# Patient Record
Sex: Female | Born: 1940 | Race: White | Hispanic: No | Marital: Married | Smoking: Former smoker
Health system: Southern US, Community
[De-identification: ages and names within clinical notes are randomized; demographics above are authoritative.]

## PROBLEM LIST (undated history)

## (undated) DIAGNOSIS — Z9981 Dependence on supplemental oxygen: Secondary | ICD-10-CM

## (undated) DIAGNOSIS — J189 Pneumonia, unspecified organism: Secondary | ICD-10-CM

## (undated) DIAGNOSIS — G709 Myoneural disorder, unspecified: Secondary | ICD-10-CM

## (undated) DIAGNOSIS — E785 Hyperlipidemia, unspecified: Secondary | ICD-10-CM

## (undated) DIAGNOSIS — E119 Type 2 diabetes mellitus without complications: Secondary | ICD-10-CM

## (undated) DIAGNOSIS — K589 Irritable bowel syndrome without diarrhea: Secondary | ICD-10-CM

## (undated) DIAGNOSIS — Z972 Presence of dental prosthetic device (complete) (partial): Secondary | ICD-10-CM

## (undated) DIAGNOSIS — K219 Gastro-esophageal reflux disease without esophagitis: Secondary | ICD-10-CM

## (undated) DIAGNOSIS — M069 Rheumatoid arthritis, unspecified: Secondary | ICD-10-CM

## (undated) DIAGNOSIS — E039 Hypothyroidism, unspecified: Secondary | ICD-10-CM

## (undated) DIAGNOSIS — J069 Acute upper respiratory infection, unspecified: Secondary | ICD-10-CM

## (undated) DIAGNOSIS — J45909 Unspecified asthma, uncomplicated: Secondary | ICD-10-CM

## (undated) DIAGNOSIS — I10 Essential (primary) hypertension: Secondary | ICD-10-CM

## (undated) DIAGNOSIS — M797 Fibromyalgia: Secondary | ICD-10-CM

## (undated) DIAGNOSIS — IMO0002 Reserved for concepts with insufficient information to code with codable children: Secondary | ICD-10-CM

## (undated) DIAGNOSIS — G2581 Restless legs syndrome: Secondary | ICD-10-CM

## (undated) DIAGNOSIS — R0600 Dyspnea, unspecified: Secondary | ICD-10-CM

## (undated) DIAGNOSIS — M329 Systemic lupus erythematosus, unspecified: Secondary | ICD-10-CM

## (undated) DIAGNOSIS — M1990 Unspecified osteoarthritis, unspecified site: Secondary | ICD-10-CM

## (undated) DIAGNOSIS — E079 Disorder of thyroid, unspecified: Secondary | ICD-10-CM

## (undated) DIAGNOSIS — J449 Chronic obstructive pulmonary disease, unspecified: Secondary | ICD-10-CM

## (undated) DIAGNOSIS — F419 Anxiety disorder, unspecified: Secondary | ICD-10-CM

## (undated) DIAGNOSIS — R921 Mammographic calcification found on diagnostic imaging of breast: Secondary | ICD-10-CM

## (undated) DIAGNOSIS — Z5189 Encounter for other specified aftercare: Secondary | ICD-10-CM

## (undated) HISTORY — DX: Unspecified osteoarthritis, unspecified site: M19.90

## (undated) HISTORY — DX: Essential (primary) hypertension: I10

## (undated) HISTORY — DX: Unspecified asthma, uncomplicated: J45.909

## (undated) HISTORY — PX: NASAL SEPTUM SURGERY: SHX37

## (undated) HISTORY — PX: ABDOMINAL HYSTERECTOMY: SHX81

## (undated) HISTORY — DX: Myoneural disorder, unspecified: G70.9

## (undated) HISTORY — DX: Acute upper respiratory infection, unspecified: J06.9

## (undated) HISTORY — PX: CHOLECYSTECTOMY: SHX55

## (undated) HISTORY — PX: KNEE ARTHROSCOPY: SUR90

## (undated) HISTORY — DX: Disorder of thyroid, unspecified: E07.9

## (undated) HISTORY — PX: EYE SURGERY: SHX253

## (undated) HISTORY — DX: Encounter for other specified aftercare: Z51.89

## (undated) HISTORY — DX: Mammographic calcification found on diagnostic imaging of breast: R92.1

## (undated) HISTORY — DX: Chronic obstructive pulmonary disease, unspecified: J44.9

## (undated) HISTORY — PX: MINIMALLY INVASIVE MICRODISCECTOMY LUMBAR SPINE: SHX2037

---
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
## 2006-10-25 ENCOUNTER — Other Ambulatory Visit: Admission: RE | Admit: 2006-10-25 | Discharge: 2006-10-25 | Payer: Self-pay | Admitting: Otolaryngology

## 2006-10-26 ENCOUNTER — Ambulatory Visit (HOSPITAL_COMMUNITY): Admission: RE | Admit: 2006-10-26 | Discharge: 2006-10-26 | Payer: Self-pay | Admitting: Otolaryngology

## 2006-11-04 ENCOUNTER — Ambulatory Visit: Admission: RE | Admit: 2006-11-04 | Discharge: 2006-11-04 | Payer: Self-pay | Admitting: Otolaryngology

## 2006-11-25 ENCOUNTER — Ambulatory Visit: Payer: Self-pay | Admitting: Pulmonary Disease

## 2013-02-06 ENCOUNTER — Other Ambulatory Visit: Payer: Self-pay | Admitting: Internal Medicine

## 2013-02-06 DIAGNOSIS — R921 Mammographic calcification found on diagnostic imaging of breast: Secondary | ICD-10-CM

## 2013-02-09 DIAGNOSIS — R0602 Shortness of breath: Secondary | ICD-10-CM

## 2013-02-14 ENCOUNTER — Ambulatory Visit
Admission: RE | Admit: 2013-02-14 | Discharge: 2013-02-14 | Disposition: A | Discharge status: Home / Self Care | Payer: Medicare Other | Source: Ambulatory Visit | Attending: Internal Medicine | Admitting: Internal Medicine

## 2013-02-14 DIAGNOSIS — R921 Mammographic calcification found on diagnostic imaging of breast: Secondary | ICD-10-CM

## 2013-02-28 ENCOUNTER — Encounter (INDEPENDENT_AMBULATORY_CARE_PROVIDER_SITE_OTHER): Payer: Self-pay | Admitting: Surgery

## 2013-02-28 ENCOUNTER — Ambulatory Visit (INDEPENDENT_AMBULATORY_CARE_PROVIDER_SITE_OTHER): Payer: Medicare Other | Admitting: Surgery

## 2013-02-28 VITALS — BP 118/78 | HR 96 | Temp 97.6°F | Resp 16 | Ht 66.0 in | Wt 183.8 lb

## 2013-02-28 DIAGNOSIS — R928 Other abnormal and inconclusive findings on diagnostic imaging of breast: Secondary | ICD-10-CM

## 2013-02-28 DIAGNOSIS — R921 Mammographic calcification found on diagnostic imaging of breast: Secondary | ICD-10-CM | POA: Insufficient documentation

## 2013-02-28 HISTORY — DX: Mammographic calcification found on diagnostic imaging of breast: R92.1

## 2013-02-28 NOTE — Progress Notes (Signed)
NAMEJAYNI Taylor DOB: 1941/01/04 MRN: [id number]                                                                                      DATE: 02/28/2013  PCP: Monico Blitz, MD Referring Provider: No ref. provider found  IMPRESSION:  Right breast calcifications and sclerosing lesion seen on the needle core biopsy  PLAN:   I have recommended wire localized excision. I have discussed the indications for the lumpectomy and described the procedure. She understand that the chance of removal of the abnormal area is very good, but that occasionally we are unable to locate it and may have to do a second procedure. We also discussed the possibility of a second procedure to get additional tissue. Risks of surgery such as bleeding and infection have also been explained, as well as the implications of not doing the surgery. She understands and wishes to proceed.                  CC:  Chief Complaint  Patient presents with  . Breast Problem    eval lt breast    HPI:  Jean Taylor is a 72 y.o.  female who presents for evaluation of The right breast. Calcifications with a complex sclerosing lesion found on needle core biopsy. Radiology recommended excision. She has had some right breast discomfort laterally but no other breast symptoms. She has no other breast problems in the past and no family history of breast cancer. She is otherwise having symptoms. The area that was biopsied is in the medial aspect of the right breast.  PMH:  has a past medical history of Arthritis; Asthma; Blood transfusion without reported diagnosis; COPD (chronic obstructive pulmonary disease); Hypertension; Neuromuscular disorder; and Thyroid disease.  PSH:   has past surgical history that includes Abdominal hysterectomy; Cholecystectomy; Nasal septum surgery; Minimally invasive microdiscectomy lumbar spine; and Knee arthroscopy (Left).  ALLERGIES:   Allergies  Allergen Reactions  . Demerol (Meperidine)   . Morphine And  Related     MEDICATIONS: Current outpatient prescriptions:doxepin (SINEQUAN) 50 MG capsule, , Disp: , Rfl: ;  EXFORGE 5-160 MG per tablet, , Disp: , Rfl: ;  gabapentin (NEURONTIN) 300 MG capsule, , Disp: , Rfl: ;  levothyroxine (SYNTHROID, LEVOTHROID) 50 MCG tablet, , Disp: , Rfl: ;  LORazepam (ATIVAN) 1 MG tablet, , Disp: , Rfl: ;  meclizine (ANTIVERT) 25 MG tablet, , Disp: , Rfl: ;  omeprazole (PRILOSEC) 20 MG capsule, , Disp: , Rfl:  theophylline (THEODUR) 200 MG 12 hr tablet, , Disp: , Rfl: ;  traMADol (ULTRAM) 50 MG tablet, , Disp: , Rfl: ;  triamcinolone cream (KENALOG) 0.1 %, , Disp: , Rfl: ;  predniSONE (STERAPRED UNI-PAK) 5 MG TABS, , Disp: , Rfl:   ROS: She has filled out our 12 point review of systems and it is negative .  VITAL SIGNS:  BP 118/78  Pulse 96  Temp(Src) 97.6 F (36.4 C) (Temporal)  Resp 16  Ht 5\' 6"  (1.676 m)  Wt 183 lb 12.8 oz (83.371 kg)  BMI 29.68 kg/m2  GENERAL:  The patient is alert, oriented, and generally healthy-appearing, NAD.  Mood and affect are normal.  HEENT:  The head is normocephalic, the eyes nonicteric, the pupils were round regular and equal. EOMs are normal. Pharynx normal. Dentition good.  NECK:  The neck is supple and there are no masses or thyromegaly.  LUNGS: Normal respirations and clear to auscultation.  HEART: Regular rhythm, with no murmurs rubs or gallops. Pulses are intact carotid dorsalis pedis and posterior tibial. No significant varicosities are noted.  BREASTS: The breasts are symmetric in size and shape. There is no palpable mass. There are no nipple skin or areolar abnormalities. She is a little tender laterally on the right. There is a small ecchymosis medially on the right.  Lymphatics: There is no axillary or supraclavicular adenopathy on either side.  ABDOMEN: Soft, flat, and nontender. No masses or organomegaly is noted. No hernias are noted. Bowel sounds are normal.  EXTREMITIES:  Good range of motion, no  edema.    DATA REVIEWED:  I reviewed pathology and radiology reports.    [name] J 02/28/2013  CC: No ref. provider found, Monico Blitz, MD

## 2013-02-28 NOTE — Patient Instructions (Signed)
We'll schedule surgery to remove the small abnormality in your right breast

## 2013-03-02 ENCOUNTER — Encounter (HOSPITAL_BASED_OUTPATIENT_CLINIC_OR_DEPARTMENT_OTHER): Payer: Self-pay | Admitting: *Deleted

## 2013-03-02 NOTE — Progress Notes (Signed)
Pt had ekg and labs 2 weeks ago-did routine stress and echo-does not see a cardiologist

## 2013-03-08 ENCOUNTER — Ambulatory Visit
Admission: RE | Admit: 2013-03-08 | Discharge: 2013-03-08 | Disposition: A | Discharge status: Home / Self Care | Payer: Medicare Other | Source: Ambulatory Visit | Attending: Surgery | Admitting: Surgery

## 2013-03-08 ENCOUNTER — Encounter (HOSPITAL_BASED_OUTPATIENT_CLINIC_OR_DEPARTMENT_OTHER): Payer: Self-pay | Admitting: *Deleted

## 2013-03-08 ENCOUNTER — Ambulatory Visit (HOSPITAL_BASED_OUTPATIENT_CLINIC_OR_DEPARTMENT_OTHER)
Admission: RE | Admit: 2013-03-08 | Discharge: 2013-03-08 | Disposition: A | Discharge status: Home / Self Care | Payer: Medicare Other | Source: Ambulatory Visit | Attending: Surgery | Admitting: Surgery

## 2013-03-08 ENCOUNTER — Encounter (HOSPITAL_BASED_OUTPATIENT_CLINIC_OR_DEPARTMENT_OTHER)
Admission: RE | Disposition: A | Discharge status: Home / Self Care | Payer: Self-pay | Source: Ambulatory Visit | Attending: Surgery

## 2013-03-08 ENCOUNTER — Ambulatory Visit (HOSPITAL_BASED_OUTPATIENT_CLINIC_OR_DEPARTMENT_OTHER): Payer: Medicare Other | Admitting: *Deleted

## 2013-03-08 DIAGNOSIS — J4489 Other specified chronic obstructive pulmonary disease: Secondary | ICD-10-CM | POA: Insufficient documentation

## 2013-03-08 DIAGNOSIS — N6089 Other benign mammary dysplasias of unspecified breast: Secondary | ICD-10-CM

## 2013-03-08 DIAGNOSIS — Z79899 Other long term (current) drug therapy: Secondary | ICD-10-CM | POA: Insufficient documentation

## 2013-03-08 DIAGNOSIS — R921 Mammographic calcification found on diagnostic imaging of breast: Secondary | ICD-10-CM

## 2013-03-08 DIAGNOSIS — G709 Myoneural disorder, unspecified: Secondary | ICD-10-CM | POA: Insufficient documentation

## 2013-03-08 DIAGNOSIS — J449 Chronic obstructive pulmonary disease, unspecified: Secondary | ICD-10-CM | POA: Insufficient documentation

## 2013-03-08 DIAGNOSIS — Z885 Allergy status to narcotic agent status: Secondary | ICD-10-CM | POA: Insufficient documentation

## 2013-03-08 DIAGNOSIS — E079 Disorder of thyroid, unspecified: Secondary | ICD-10-CM | POA: Insufficient documentation

## 2013-03-08 DIAGNOSIS — M129 Arthropathy, unspecified: Secondary | ICD-10-CM | POA: Insufficient documentation

## 2013-03-08 DIAGNOSIS — Z90710 Acquired absence of both cervix and uterus: Secondary | ICD-10-CM | POA: Insufficient documentation

## 2013-03-08 DIAGNOSIS — N6039 Fibrosclerosis of unspecified breast: Secondary | ICD-10-CM | POA: Insufficient documentation

## 2013-03-08 DIAGNOSIS — I10 Essential (primary) hypertension: Secondary | ICD-10-CM | POA: Insufficient documentation

## 2013-03-08 HISTORY — DX: Restless legs syndrome: G25.81

## 2013-03-08 HISTORY — DX: Rheumatoid arthritis, unspecified: M06.9

## 2013-03-08 HISTORY — PX: BREAST BIOPSY: SHX20

## 2013-03-08 HISTORY — DX: Anxiety disorder, unspecified: F41.9

## 2013-03-08 HISTORY — DX: Presence of dental prosthetic device (complete) (partial): Z97.2

## 2013-03-08 HISTORY — DX: Fibromyalgia: M79.7

## 2013-03-08 LAB — POCT I-STAT, CHEM 8
BUN: 10 mg/dL (ref 6–23)
Calcium, Ion: 1.18 mmol/L (ref 1.13–1.30)
Chloride: 101 mEq/L (ref 96–112)
Creatinine, Ser: 1 mg/dL (ref 0.50–1.10)
Glucose, Bld: 108 mg/dL — ABNORMAL HIGH (ref 70–99)
Potassium: 4.1 mEq/L (ref 3.5–5.1)

## 2013-03-08 SURGERY — BREAST BIOPSY WITH NEEDLE LOCALIZATION
Anesthesia: General | Site: Breast | Laterality: Right | Wound class: Clean

## 2013-03-08 MED ORDER — HYDROMORPHONE HCL PF 1 MG/ML IJ SOLN: 0.2500 mg | INTRAMUSCULAR | Status: DC | PRN

## 2013-03-08 MED ORDER — OXYCODONE HCL 5 MG PO TABS: 5.0000 mg | ORAL_TABLET | Freq: Once | ORAL | Status: DC | PRN

## 2013-03-08 MED ORDER — EPHEDRINE SULFATE 50 MG/ML IJ SOLN
INTRAMUSCULAR | Status: DC | PRN
  Administered 2013-03-08: 5 mg via INTRAVENOUS
  Administered 2013-03-08: 10 mg via INTRAVENOUS
  Administered 2013-03-08: 5 mg via INTRAVENOUS

## 2013-03-08 MED ORDER — PROPOFOL 10 MG/ML IV BOLUS
INTRAVENOUS | Status: DC | PRN
  Administered 2013-03-08: 160 mg via INTRAVENOUS

## 2013-03-08 MED ORDER — LIDOCAINE HCL (CARDIAC) 20 MG/ML IV SOLN
INTRAVENOUS | Status: DC | PRN
  Administered 2013-03-08: 100 mg via INTRAVENOUS

## 2013-03-08 MED ORDER — PROMETHAZINE HCL 25 MG/ML IJ SOLN: 6.2500 mg | INTRAMUSCULAR | Status: DC | PRN

## 2013-03-08 MED ORDER — MIDAZOLAM HCL 5 MG/5ML IJ SOLN
INTRAMUSCULAR | Status: DC | PRN
  Administered 2013-03-08: 1 mg via INTRAVENOUS

## 2013-03-08 MED ORDER — BUPIVACAINE HCL (PF) 0.25 % IJ SOLN
INTRAMUSCULAR | Status: DC | PRN
  Administered 2013-03-08: 30 mL

## 2013-03-08 MED ORDER — CEFAZOLIN SODIUM-DEXTROSE 2-3 GM-% IV SOLR
2.0000 g | INTRAVENOUS | Status: AC
  Administered 2013-03-08: 2 g via INTRAVENOUS

## 2013-03-08 MED ORDER — OXYCODONE HCL 5 MG/5ML PO SOLN: 5.0000 mg | Freq: Once | ORAL | Status: DC | PRN

## 2013-03-08 MED ORDER — FENTANYL CITRATE 0.05 MG/ML IJ SOLN
INTRAMUSCULAR | Status: DC | PRN
  Administered 2013-03-08: 50 ug via INTRAVENOUS

## 2013-03-08 MED ORDER — LACTATED RINGERS IV SOLN
INTRAVENOUS | Status: DC
  Administered 2013-03-08 (×2): via INTRAVENOUS

## 2013-03-08 MED ORDER — CHLORHEXIDINE GLUCONATE 4 % EX LIQD: 1.0000 "application " | Freq: Once | CUTANEOUS | Status: DC

## 2013-03-08 MED ORDER — DEXAMETHASONE SODIUM PHOSPHATE 4 MG/ML IJ SOLN
INTRAMUSCULAR | Status: DC | PRN
  Administered 2013-03-08: 8 mg via INTRAVENOUS

## 2013-03-08 MED ORDER — HYDROCODONE-ACETAMINOPHEN 5-325 MG PO TABS: 1.0000 | ORAL_TABLET | ORAL | Status: DC | PRN

## 2013-03-08 MED ORDER — ONDANSETRON HCL 4 MG/2ML IJ SOLN
INTRAMUSCULAR | Status: DC | PRN
  Administered 2013-03-08: 4 mg via INTRAVENOUS

## 2013-03-08 SURGICAL SUPPLY — 48 items
APPLICATOR COTTON TIP 6IN STRL (MISCELLANEOUS) IMPLANT
BINDER BREAST LRG (GAUZE/BANDAGES/DRESSINGS) IMPLANT
BINDER BREAST MEDIUM (GAUZE/BANDAGES/DRESSINGS) IMPLANT
BINDER BREAST XLRG (GAUZE/BANDAGES/DRESSINGS) ×2 IMPLANT
BINDER BREAST XXLRG (GAUZE/BANDAGES/DRESSINGS) IMPLANT
BLADE HEX COATED 2.75 (ELECTRODE) ×2 IMPLANT
BLADE SURG 15 STRL LF DISP TIS (BLADE) ×1 IMPLANT
BLADE SURG 15 STRL SS (BLADE) ×1
CANISTER SUCTION 1200CC (MISCELLANEOUS) ×2 IMPLANT
CHLORAPREP W/TINT 26ML (MISCELLANEOUS) ×2 IMPLANT
CLIP TI MEDIUM 6 (CLIP) IMPLANT
CLIP TI WIDE RED SMALL 6 (CLIP) IMPLANT
CLOTH BEACON ORANGE TIMEOUT ST (SAFETY) ×2 IMPLANT
COVER MAYO STAND STRL (DRAPES) ×2 IMPLANT
COVER TABLE BACK 60X90 (DRAPES) ×2 IMPLANT
DECANTER SPIKE VIAL GLASS SM (MISCELLANEOUS) IMPLANT
DERMABOND ADVANCED (GAUZE/BANDAGES/DRESSINGS) ×1
DERMABOND ADVANCED .7 DNX12 (GAUZE/BANDAGES/DRESSINGS) ×1 IMPLANT
DEVICE DUBIN W/COMP PLATE 8390 (MISCELLANEOUS) ×4 IMPLANT
DRAPE LAPAROTOMY TRNSV 102X78 (DRAPE) ×2 IMPLANT
DRAPE SURG 17X23 STRL (DRAPES) IMPLANT
DRAPE UTILITY XL STRL (DRAPES) ×2 IMPLANT
ELECT REM PT RETURN 9FT ADLT (ELECTROSURGICAL) ×2
ELECTRODE REM PT RTRN 9FT ADLT (ELECTROSURGICAL) ×1 IMPLANT
GLOVE BIOGEL M STRL SZ7.5 (GLOVE) ×2 IMPLANT
GLOVE BIOGEL PI IND STRL 8 (GLOVE) ×1 IMPLANT
GLOVE BIOGEL PI INDICATOR 8 (GLOVE) ×1
GLOVE EUDERMIC 7 POWDERFREE (GLOVE) ×2 IMPLANT
GLOVE EXAM NITRILE LRG STRL (GLOVE) ×2 IMPLANT
GOWN PREVENTION PLUS XLARGE (GOWN DISPOSABLE) ×4 IMPLANT
KIT MARKER MARGIN INK (KITS) ×2 IMPLANT
NEEDLE HYPO 25X1 1.5 SAFETY (NEEDLE) ×2 IMPLANT
NS IRRIG 1000ML POUR BTL (IV SOLUTION) IMPLANT
PACK BASIN DAY SURGERY FS (CUSTOM PROCEDURE TRAY) ×2 IMPLANT
PENCIL BUTTON HOLSTER BLD 10FT (ELECTRODE) ×2 IMPLANT
SHEET MEDIUM DRAPE 40X70 STRL (DRAPES) IMPLANT
SLEEVE SCD COMPRESS KNEE MED (MISCELLANEOUS) ×2 IMPLANT
SPONGE GAUZE 4X4 12PLY (GAUZE/BANDAGES/DRESSINGS) IMPLANT
SPONGE INTESTINAL PEANUT (DISPOSABLE) IMPLANT
SPONGE LAP 4X18 X RAY DECT (DISPOSABLE) ×2 IMPLANT
STAPLER VISISTAT 35W (STAPLE) IMPLANT
SUT MNCRL AB 4-0 PS2 18 (SUTURE) ×2 IMPLANT
SUT SILK 0 TIES 10X30 (SUTURE) IMPLANT
SUT VICRYL 3-0 CR8 SH (SUTURE) ×2 IMPLANT
SYR CONTROL 10ML LL (SYRINGE) ×2 IMPLANT
TOWEL OR NON WOVEN STRL DISP B (DISPOSABLE) ×2 IMPLANT
TUBE CONNECTING 20X1/4 (TUBING) ×2 IMPLANT
YANKAUER SUCT BULB TIP NO VENT (SUCTIONS) ×2 IMPLANT

## 2013-03-08 NOTE — Anesthesia Procedure Notes (Signed)
Procedure Name: LMA Insertion Date/Time: 03/08/2013 12:43 PM Performed by: Carney [place]ers Pre-anesthesia Checklist: Patient identified, Emergency Drugs available, Suction available and Patient being monitored Patient Re-evaluated:Patient Re-evaluated prior to inductionOxygen Delivery Method: Circle System Utilized Preoxygenation: Pre-oxygenation with 100% oxygen Intubation Type: IV induction Ventilation: Mask ventilation without difficulty LMA: LMA inserted LMA Size: 4.0 Number of attempts: 1 Airway Equipment and Method: bite block Placement Confirmation: positive ETCO2 and breath sounds checked- equal and bilateral Tube secured with: Tape Dental Injury: Teeth and Oropharynx as per pre-operative assessment

## 2013-03-08 NOTE — Op Note (Signed)
Jean Taylor 1940/11/21 [id number] 03/01/2013  Preoperative diagnosis: right breast calcifications and complex sclerosing lesion on needle core biopsy  Postoperative diagnosis: same  Procedure: wire localized excision of breast calcifications and a complex sclerosing lesion right breast  Surgeon: Haywood Lasso, MD, FACS   Anesthesia: General   Clinical History and Indications: this patient has an area of calcifications and a small nodule and biopsies have shown a complex sclerosing lesion. Wire localized excision was recommended.    Description of Procedure: the patient was seen in the preoperative area, plans for surgery confirmed, the right breast marked as the operative side, and the guidewire films were reviewed. The patient was taken to the operating room and after satisfactory general anesthesia was obtained, the right breast was prepped and draped in the time out was performed.  There were 2 guidewires, both anteromedially and tracks towards the areola. I made a transverse incision midway between the guidewires parallel to the tract of the guidewires. I then elevated the skin medially, inferiorly and superiorly. Identify the breast tissue down towards the chest wall starting medially and then working both superior and inferior and with traction going lateral to the guidewires. I had removed all the tissue between the wires. The specimen mammogram showed the calcifications but not the clip. In discussing this with the radiologist he thought the clip was near the more inferior wire and fairly superficial. I looked in that area and sought that just inferior to the inferior wire an area of tissue that I thought contained the clip and this was removed. Specimen mammogram showed this did contain the clip.  The area was irrigated and made sure everything was dry. 20 cc of 0.25% plain Marcaine was infiltrated for postop pain relief. The incision was closed with interrupted 3-0 Vicryl,  running 4-0 Monocryl subcuticular, and Dermabond.  The patient tolerated the procedure well. There are no complications. Counts were correct. Blood loss was minimal.  Haywood Lasso, MD, FACS 03/08/2013 1:32 PM

## 2013-03-08 NOTE — H&P (View-Only) (Signed)
Jean Taylor DOB: November 28, 1940 MRN: [id number]                                                                                      DATE: 02/28/2013  PCP: Monico Blitz, MD Referring Provider: No ref. provider found  IMPRESSION:  Right breast calcifications and sclerosing lesion seen on the needle core biopsy  PLAN:   I have recommended wire localized excision. I have discussed the indications for the lumpectomy and described the procedure. She understand that the chance of removal of the abnormal area is very good, but that occasionally we are unable to locate it and may have to do a second procedure. We also discussed the possibility of a second procedure to get additional tissue. Risks of surgery such as bleeding and infection have also been explained, as well as the implications of not doing the surgery. She understands and wishes to proceed.                  CC:  Chief Complaint  Patient presents with  . Breast Problem    eval lt breast    HPI:  Jean Taylor is a 72 y.o.  female who presents for evaluation of The right breast. Calcifications with a complex sclerosing lesion found on needle core biopsy. Radiology recommended excision. She has had some right breast discomfort laterally but no other breast symptoms. She has no other breast problems in the past and no family history of breast cancer. She is otherwise having symptoms. The area that was biopsied is in the medial aspect of the right breast.  PMH:  has a past medical history of Arthritis; Asthma; Blood transfusion without reported diagnosis; COPD (chronic obstructive pulmonary disease); Hypertension; Neuromuscular disorder; and Thyroid disease.  PSH:   has past surgical history that includes Abdominal hysterectomy; Cholecystectomy; Nasal septum surgery; Minimally invasive microdiscectomy lumbar spine; and Knee arthroscopy (Left).  ALLERGIES:   Allergies  Allergen Reactions  . Demerol (Meperidine)   . Morphine And  Related     MEDICATIONS: Current outpatient prescriptions:doxepin (SINEQUAN) 50 MG capsule, , Disp: , Rfl: ;  EXFORGE 5-160 MG per tablet, , Disp: , Rfl: ;  gabapentin (NEURONTIN) 300 MG capsule, , Disp: , Rfl: ;  levothyroxine (SYNTHROID, LEVOTHROID) 50 MCG tablet, , Disp: , Rfl: ;  LORazepam (ATIVAN) 1 MG tablet, , Disp: , Rfl: ;  meclizine (ANTIVERT) 25 MG tablet, , Disp: , Rfl: ;  omeprazole (PRILOSEC) 20 MG capsule, , Disp: , Rfl:  theophylline (THEODUR) 200 MG 12 hr tablet, , Disp: , Rfl: ;  traMADol (ULTRAM) 50 MG tablet, , Disp: , Rfl: ;  triamcinolone cream (KENALOG) 0.1 %, , Disp: , Rfl: ;  predniSONE (STERAPRED UNI-PAK) 5 MG TABS, , Disp: , Rfl:   ROS: She has filled out our 12 point review of systems and it is negative .  VITAL SIGNS:  BP 118/78  Pulse 96  Temp(Src) 97.6 F (36.4 C) (Temporal)  Resp 16  Ht 5\' 6"  (1.676 m)  Wt 183 lb 12.8 oz (83.371 kg)  BMI 29.68 kg/m2  GENERAL:  The patient is alert, oriented, and generally healthy-appearing, NAD.  Mood and affect are normal.  HEENT:  The head is normocephalic, the eyes nonicteric, the pupils were round regular and equal. EOMs are normal. Pharynx normal. Dentition good.  NECK:  The neck is supple and there are no masses or thyromegaly.  LUNGS: Normal respirations and clear to auscultation.  HEART: Regular rhythm, with no murmurs rubs or gallops. Pulses are intact carotid dorsalis pedis and posterior tibial. No significant varicosities are noted.  BREASTS: The breasts are symmetric in size and shape. There is no palpable mass. There are no nipple skin or areolar abnormalities. She is a little tender laterally on the right. There is a small ecchymosis medially on the right.  Lymphatics: There is no axillary or supraclavicular adenopathy on either side.  ABDOMEN: Soft, flat, and nontender. No masses or organomegaly is noted. No hernias are noted. Bowel sounds are normal.  EXTREMITIES:  Good range of motion, no  edema.    DATA REVIEWED:  I reviewed pathology and radiology reports.    Britten Parady J 02/28/2013  CC: No ref. provider found, Monico Blitz, MD

## 2013-03-08 NOTE — Interval H&P Note (Signed)
History and Physical Interval Note:  03/08/2013 12:25 PM  Jean Taylor  has presented today for surgery, with the diagnosis of right breast calcifications  The various methods of treatment have been discussed with the patient and family. After consideration of risks, benefits and other options for treatment, the patient has consented to  Procedure(s):  NEEDLE LOCALIZATION REMOVAL RIGHT BREAST CALCIFICATIONS (Right) as a surgical intervention .  The patient's history has been reviewed, patient examined, no change in status, stable for surgery.  I have reviewed the patient's chart and labs.  Questions were answered to the patient's satisfaction.    The right breast is marked as the operative side. I reviewed the wire localizing films   Paulette Lynch J

## 2013-03-08 NOTE — Anesthesia Preprocedure Evaluation (Addendum)
Anesthesia Evaluation  Patient identified by MRN, date of birth, ID band Patient awake    Reviewed: Allergy & Precautions, H&P , NPO status , Patient's Chart, lab work & pertinent test results  History of Anesthesia Complications Negative for: history of anesthetic complications  Airway Mallampati: II  Neck ROM: Full  Mouth opening: Limited Mouth Opening  Dental  (+) Teeth Intact, Edentulous Upper and Partial Lower   Pulmonary asthma , COPD breath sounds clear to auscultation        Cardiovascular hypertension, Rhythm:Regular Rate:Normal     Neuro/Psych  Neuromuscular disease    GI/Hepatic   Endo/Other    Renal/GU      Musculoskeletal  (+) Arthritis -, Fibromyalgia -  Abdominal   Peds  Hematology   Anesthesia Other Findings   Reproductive/Obstetrics                          Anesthesia Physical Anesthesia Plan  ASA: III  Anesthesia Plan: General   Post-op Pain Management:    Induction: Intravenous  Airway Management Planned: LMA  Additional Equipment:   Intra-op Plan:   Post-operative Plan: Extubation in OR  Informed Consent:   Dental advisory given  Plan Discussed with: CRNA and Surgeon  Anesthesia Plan Comments:         Anesthesia Quick Evaluation

## 2013-03-08 NOTE — Transfer of Care (Signed)
Immediate Anesthesia Transfer of Care Note  Patient: Jean Taylor  Procedure(s) Performed: Procedure(s):  NEEDLE LOCALIZATION REMOVAL RIGHT BREAST CALCIFICATIONS (Right)  Patient Location: PACU  Anesthesia Type:General  Level of Consciousness: awake, alert  and oriented  Airway & Oxygen Therapy: Patient Spontanous Breathing and Patient connected to face mask oxygen  Post-op Assessment: Report given to PACU RN, Post -op Vital signs reviewed and stable and Patient moving all extremities  Post vital signs: Reviewed and stable  Complications: No apparent anesthesia complications

## 2013-03-09 ENCOUNTER — Encounter (HOSPITAL_BASED_OUTPATIENT_CLINIC_OR_DEPARTMENT_OTHER): Payer: Self-pay | Admitting: Surgery

## 2013-03-12 NOTE — Anesthesia Postprocedure Evaluation (Signed)
  Anesthesia Post-op Note  Patient: Jean Taylor  Procedure(s) Performed: Procedure(s):  NEEDLE LOCALIZATION REMOVAL RIGHT BREAST CALCIFICATIONS (Right)  Patient Location: PACU  Anesthesia Type:General  Level of Consciousness: awake  Airway and Oxygen Therapy: Patient Spontanous Breathing  Post-op Pain: mild  Post-op Assessment: Post-op Vital signs reviewed  Post-op Vital Signs: stable  Complications: No apparent anesthesia complications

## 2013-03-15 ENCOUNTER — Telehealth (INDEPENDENT_AMBULATORY_CARE_PROVIDER_SITE_OTHER): Payer: Self-pay

## 2013-03-15 NOTE — Telephone Encounter (Signed)
Pt inquiring about path results.  Per Dr. Dickie La note, pt informed path is benign as expected.  Pt verbalized understanding.

## 2013-03-15 NOTE — Telephone Encounter (Signed)
LMOM asking pt to call office back for pathology results - path is benign and as expected

## 2013-03-21 ENCOUNTER — Telehealth (INDEPENDENT_AMBULATORY_CARE_PROVIDER_SITE_OTHER): Payer: Self-pay

## 2013-03-21 NOTE — Telephone Encounter (Signed)
The pt called to report that she has noticed since yesterday that she has some lumps to the side of the incision.  No redness, or drainage or fever.  I told the pt that it may be the lumpiness from the sutures under the skin or some scar tissue.  Please call if you think differently.

## 2013-03-28 ENCOUNTER — Encounter (INDEPENDENT_AMBULATORY_CARE_PROVIDER_SITE_OTHER): Payer: Self-pay | Admitting: Surgery

## 2013-03-28 ENCOUNTER — Other Ambulatory Visit: Payer: Self-pay

## 2013-03-28 ENCOUNTER — Ambulatory Visit (INDEPENDENT_AMBULATORY_CARE_PROVIDER_SITE_OTHER): Payer: Medicare Other | Admitting: Surgery

## 2013-03-28 VITALS — BP 132/82 | HR 68 | Temp 97.0°F | Resp 16 | Ht 66.0 in | Wt 183.8 lb

## 2013-03-28 DIAGNOSIS — R928 Other abnormal and inconclusive findings on diagnostic imaging of breast: Secondary | ICD-10-CM

## 2013-03-28 DIAGNOSIS — R921 Mammographic calcification found on diagnostic imaging of breast: Secondary | ICD-10-CM

## 2013-03-28 DIAGNOSIS — Z09 Encounter for follow-up examination after completed treatment for conditions other than malignant neoplasm: Secondary | ICD-10-CM

## 2013-03-28 NOTE — Progress Notes (Signed)
NAME: Jean Taylor                                            DOB: Nov 23, 1940 DATE: 03/28/2013                                                  MRN: [id number]  CC:  Chief Complaint  Patient presents with  . Routine Post Op    1st p/o rt br    HPI: This patient comes in for post op follow-up .Sheunderwent NL excision of right breast calcifications on 02/26/13. She feels that she is doing well.  PE:  VITAL SIGNS: BP 132/82  Pulse 68  Temp(Src) 97 F (36.1 C) (Temporal)  Resp 16  Ht 5\' 6"  (1.676 m)  Wt 183 lb 12.8 oz (83.371 kg)  BMI 29.68 kg/m2  General: The patient appears to be healthy, NAD Incision: Clean and healing nicely  DATA REVIEWED: Diagnosis 1. Breast, lumpectomy, Right - BREAST PARENCHYMA WITH USUAL DUCTAL HYPERPLASIA. - EXTENSIVE FAT NECROSIS AND BIOPSY SITE CHANGES PRESENT. - NO ATYPIA OR MALIGNANCY IDENTIFIED. 2. Breast, excision, Right additional tissue - BREAST PARENCHYMA WITH FOCAL USUAL DUCTAL HYPERPLASIA. - NO ATYPIA OR MALIGNANCY IDENTIFIED. Microscopic Comment 1. -2. The margins on both specimens are inked and histologically examined. (RAH:caf 03/12/13) Willeen Niece MD Pathologist, Electronic Signature  IMPRESSION: The patient is doing well S/P excisional biopsy.    PLAN: RTC PRN. I gave the patient a copy of the pathology report and reviewed it with her

## 2013-03-28 NOTE — Patient Instructions (Signed)
We will see you again on an as needed basis. Please call the office at [phone] if you have any questions or concerns. Thank you for allowing us to take care of you.  

## 2013-06-28 ENCOUNTER — Other Ambulatory Visit: Payer: Self-pay

## 2014-01-22 ENCOUNTER — Encounter (INDEPENDENT_AMBULATORY_CARE_PROVIDER_SITE_OTHER): Payer: Self-pay | Admitting: *Deleted

## 2015-01-03 ENCOUNTER — Other Ambulatory Visit: Payer: Self-pay | Admitting: Neurosurgery

## 2015-01-03 DIAGNOSIS — M4726 Other spondylosis with radiculopathy, lumbar region: Secondary | ICD-10-CM

## 2015-01-23 ENCOUNTER — Ambulatory Visit
Admission: RE | Admit: 2015-01-23 | Discharge: 2015-01-23 | Disposition: A | Discharge status: Home / Self Care | Payer: Medicare Other | Source: Ambulatory Visit | Attending: Neurosurgery | Admitting: Neurosurgery

## 2015-01-23 DIAGNOSIS — M4726 Other spondylosis with radiculopathy, lumbar region: Secondary | ICD-10-CM

## 2015-01-27 ENCOUNTER — Other Ambulatory Visit (HOSPITAL_COMMUNITY): Payer: Self-pay | Admitting: Neurosurgery

## 2015-01-27 DIAGNOSIS — M4726 Other spondylosis with radiculopathy, lumbar region: Secondary | ICD-10-CM

## 2015-02-12 ENCOUNTER — Encounter (HOSPITAL_COMMUNITY): Payer: Self-pay | Admitting: Anesthesiology

## 2015-02-12 NOTE — Progress Notes (Signed)
Called pt for pre-op call. Pt states she has cancelled the MRI with anesthesia. I called and spoke with Jean Taylor at Dr. Donnella Bi office and she states she will make sure the office is aware. I also called MRI and told them the patient would not be coming for her MRI.

## 2015-02-13 ENCOUNTER — Ambulatory Visit (HOSPITAL_COMMUNITY): Admission: RE | Admit: 2015-02-13 | Payer: Medicare Other | Source: Ambulatory Visit | Admitting: Neurosurgery

## 2015-02-13 ENCOUNTER — Encounter (HOSPITAL_COMMUNITY): Admission: RE | Payer: Self-pay | Source: Ambulatory Visit

## 2015-02-13 ENCOUNTER — Ambulatory Visit (HOSPITAL_COMMUNITY)
Admission: RE | Admit: 2015-02-13 | Discharge: 2015-02-13 | Disposition: A | Discharge status: Home / Self Care | Payer: Medicare Other | Source: Ambulatory Visit | Attending: Neurosurgery | Admitting: Neurosurgery

## 2015-02-13 SURGERY — RADIOLOGY WITH ANESTHESIA
Anesthesia: General

## 2015-07-03 ENCOUNTER — Ambulatory Visit (HOSPITAL_COMMUNITY)
Admission: RE | Admit: 2015-07-03 | Discharge: 2015-07-03 | Disposition: A | Discharge status: Home / Self Care | Payer: Medicare Other | Source: Ambulatory Visit | Attending: Neurosurgery | Admitting: Neurosurgery

## 2015-07-03 DIAGNOSIS — M4726 Other spondylosis with radiculopathy, lumbar region: Secondary | ICD-10-CM

## 2015-07-09 ENCOUNTER — Encounter (HOSPITAL_COMMUNITY): Payer: Self-pay | Admitting: *Deleted

## 2015-07-09 NOTE — Progress Notes (Signed)
Pt denies cardiac history or chest pain. Pt has history of COPD and asthma.

## 2015-07-09 NOTE — Progress Notes (Signed)
Called Dr. Donnella Bi office for H&P, spoke with Manuela Schwartz.

## 2015-07-10 ENCOUNTER — Ambulatory Visit (HOSPITAL_COMMUNITY)
Admission: RE | Admit: 2015-07-10 | Discharge: 2015-07-10 | Disposition: A | Discharge status: Home / Self Care | Payer: Medicare Other | Source: Ambulatory Visit | Attending: Neurosurgery | Admitting: Neurosurgery

## 2015-07-10 ENCOUNTER — Encounter (HOSPITAL_COMMUNITY)
Admission: RE | Disposition: A | Discharge status: Home / Self Care | Payer: Self-pay | Source: Ambulatory Visit | Attending: Neurosurgery

## 2015-07-10 ENCOUNTER — Ambulatory Visit (HOSPITAL_COMMUNITY): Payer: Medicare Other | Admitting: Anesthesiology

## 2015-07-10 ENCOUNTER — Encounter (HOSPITAL_COMMUNITY): Payer: Self-pay | Admitting: *Deleted

## 2015-07-10 DIAGNOSIS — E039 Hypothyroidism, unspecified: Secondary | ICD-10-CM | POA: Diagnosis not present

## 2015-07-10 DIAGNOSIS — F17210 Nicotine dependence, cigarettes, uncomplicated: Secondary | ICD-10-CM | POA: Diagnosis not present

## 2015-07-10 DIAGNOSIS — M5136 Other intervertebral disc degeneration, lumbar region: Secondary | ICD-10-CM | POA: Diagnosis not present

## 2015-07-10 DIAGNOSIS — M329 Systemic lupus erythematosus, unspecified: Secondary | ICD-10-CM | POA: Diagnosis not present

## 2015-07-10 DIAGNOSIS — M5126 Other intervertebral disc displacement, lumbar region: Secondary | ICD-10-CM | POA: Insufficient documentation

## 2015-07-10 DIAGNOSIS — F413 Other mixed anxiety disorders: Secondary | ICD-10-CM | POA: Diagnosis not present

## 2015-07-10 DIAGNOSIS — M797 Fibromyalgia: Secondary | ICD-10-CM | POA: Diagnosis present

## 2015-07-10 DIAGNOSIS — M4806 Spinal stenosis, lumbar region: Secondary | ICD-10-CM | POA: Diagnosis not present

## 2015-07-10 DIAGNOSIS — M1990 Unspecified osteoarthritis, unspecified site: Secondary | ICD-10-CM | POA: Diagnosis not present

## 2015-07-10 DIAGNOSIS — J449 Chronic obstructive pulmonary disease, unspecified: Secondary | ICD-10-CM | POA: Insufficient documentation

## 2015-07-10 DIAGNOSIS — J45909 Unspecified asthma, uncomplicated: Secondary | ICD-10-CM | POA: Insufficient documentation

## 2015-07-10 DIAGNOSIS — I10 Essential (primary) hypertension: Secondary | ICD-10-CM | POA: Insufficient documentation

## 2015-07-10 HISTORY — DX: Pneumonia, unspecified organism: J18.9

## 2015-07-10 HISTORY — DX: Hypothyroidism, unspecified: E03.9

## 2015-07-10 HISTORY — DX: Systemic lupus erythematosus, unspecified: M32.9

## 2015-07-10 HISTORY — DX: Gastro-esophageal reflux disease without esophagitis: K21.9

## 2015-07-10 HISTORY — PX: RADIOLOGY WITH ANESTHESIA: SHX6223

## 2015-07-10 HISTORY — DX: Reserved for concepts with insufficient information to code with codable children: IMO0002

## 2015-07-10 HISTORY — DX: Irritable bowel syndrome, unspecified: K58.9

## 2015-07-10 LAB — CBC
HEMATOCRIT: 41.7 % (ref 36.0–46.0)
HEMOGLOBIN: 13.6 g/dL (ref 12.0–15.0)
MCH: 28 pg (ref 26.0–34.0)
MCHC: 32.6 g/dL (ref 30.0–36.0)
MCV: 86 fL (ref 78.0–100.0)
Platelets: 205 10*3/uL (ref 150–400)
RBC: 4.85 MIL/uL (ref 3.87–5.11)
RDW: 14.2 % (ref 11.5–15.5)
WBC: 6 10*3/uL (ref 4.0–10.5)

## 2015-07-10 LAB — BASIC METABOLIC PANEL
ANION GAP: 8 (ref 5–15)
BUN: 15 mg/dL (ref 6–20)
CALCIUM: 9.5 mg/dL (ref 8.9–10.3)
CHLORIDE: 100 mmol/L — AB (ref 101–111)
CO2: 27 mmol/L (ref 22–32)
Creatinine, Ser: 0.99 mg/dL (ref 0.44–1.00)
GFR calc non Af Amer: 54 mL/min — ABNORMAL LOW (ref >60)
GLUCOSE: 124 mg/dL — AB (ref 65–99)
POTASSIUM: 4.9 mmol/L (ref 3.5–5.1)
Sodium: 135 mmol/L (ref 135–145)

## 2015-07-10 SURGERY — RADIOLOGY WITH ANESTHESIA
Anesthesia: General

## 2015-07-10 MED ORDER — ACETAMINOPHEN 160 MG/5ML PO SOLN
325.0000 mg | ORAL | Status: DC | PRN
  Filled 2015-07-10: qty 20.3

## 2015-07-10 MED ORDER — ONDANSETRON HCL 4 MG/2ML IJ SOLN: 4.0000 mg | Freq: Four times a day (QID) | INTRAMUSCULAR | Status: DC | PRN

## 2015-07-10 MED ORDER — GADOBENATE DIMEGLUMINE 529 MG/ML IV SOLN
17.0000 mL | Freq: Once | INTRAVENOUS | Status: AC | PRN
  Administered 2015-07-10: 17 mL via INTRAVENOUS

## 2015-07-10 MED ORDER — ACETAMINOPHEN 325 MG PO TABS: 325.0000 mg | ORAL_TABLET | ORAL | Status: DC | PRN

## 2015-07-10 NOTE — Transfer of Care (Signed)
Immediate Anesthesia Transfer of Care Note  Patient: Jean Taylor  Procedure(s) Performed: Procedure(s): MRI (N/A)  Patient Location: PACU  Anesthesia Type:General  Level of Consciousness: awake, alert , oriented and patient cooperative  Airway & Oxygen Therapy: Patient Spontanous Breathing and Patient connected to nasal cannula oxygen  Post-op Assessment: Report given to RN, Post -op Vital signs reviewed and stable and Patient moving all extremities X 4  Post vital signs: Reviewed and stable  Last Vitals:  Filed Vitals:   07/10/15 1410  BP:   Pulse:   Temp: 36.8 C  Resp:     Complications: No apparent anesthesia complications

## 2015-07-10 NOTE — Anesthesia Preprocedure Evaluation (Signed)
Anesthesia Evaluation  Patient identified by MRN, date of birth, ID band Patient awake    Reviewed: Allergy & Precautions, NPO status , Patient's Chart, lab work & pertinent test results  Airway Mallampati: II   Neck ROM: full    Dental   Pulmonary asthma , COPD, Current Smoker,    breath sounds clear to auscultation       Cardiovascular hypertension,  Rhythm:regular Rate:Normal     Neuro/Psych Anxiety  Neuromuscular disease    GI/Hepatic GERD  ,  Endo/Other  Hypothyroidism   Renal/GU      Musculoskeletal  (+) Arthritis , Fibromyalgia -  Abdominal   Peds  Hematology   Anesthesia Other Findings   Reproductive/Obstetrics                             Anesthesia Physical Anesthesia Plan  ASA: II  Anesthesia Plan: General   Post-op Pain Management:    Induction: Intravenous  Airway Management Planned: LMA  Additional Equipment:   Intra-op Plan:   Post-operative Plan:   Informed Consent: I have reviewed the patients History and Physical, chart, labs and discussed the procedure including the risks, benefits and alternatives for the proposed anesthesia with the patient or authorized representative who has indicated his/her understanding and acceptance.     Plan Discussed with: CRNA, Anesthesiologist and Surgeon  Anesthesia Plan Comments:         Anesthesia Quick Evaluation

## 2015-07-10 NOTE — H&P (Signed)
HISTORY OF PRESENT ILLNESS:                     The patient is a 74 year old, right-handed white female, former patient of mine since 35.  In 05/1996, we performed a right L4 hemilaminotomy and foraminotomy for decompression of the exiting L4 and L5 nerve roots.  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  She was last evaluated with MRI at [facility] in 2013.  She did have a left L4-5 translaminar epidural steroid injection in 08/2012.  We saw her a couple of months later but she did not return for subsequent followup.  The patient explains that she has been having difficulties with her low back.  They have been worsening.  She is typically comfortable when sedentary, such as sitting and lying, but the pain is aggravated by activities such as vacuuming, washing the dishes, folding clothes, walking and so on.  She complains of pain across the low back with occasional discomfort into the right buttock, but the primary pain is across the low back itself.  She describes a "icy" feeling in the lateral aspect of the left thigh.  She does not describe any numbness, paresthesias, or weakness.  She does take tramadol 50 mg tablets two to three time a day for the pain.  It is prescribed primarily for her fibromyalgia.  Because of the increasing difficulties with her low back she contacted the office requesting reevaluation.  REVIEW OF SYSTEMS:                                    Review of systems is notable for those described in history of present illness and past medical history, but a 14-point review-of-systems sheet is otherwise unremarkable.    PAST MEDICAL HISTORY:                                Past medical history notable for history of hypertension, asthma, COPD, SLE, RA, hypothyroidism, fibromyalgia.  She does not describe any history of myocardial infarction, cancer, stroke or diabetes.  . Prior Operations:   Previous surgery includes her lumbar surgery as describe above as well as removal of a couple of nodules from her right breast, hysterectomy, cholecystectomy, deviated septum surgery and left knee surgery.  . Medications and Allergies:  SHE REPORTS INTOLERANCES OR ALLERGIES TO MEDICATIONS; DEMEROL CAUSES HALLUCINATIONS AND MORPHINE CAUSES NAUSEA AND VOMITING.  Current medications include amlodipine 5 mg daily, aspirin 81 mg daily, doxepin 100 mcg at bedtime, gabapentin 300 mg t.i.d., levothyroxine 50 mcg daily, lorazepam 1 mg b.i.d., losartan 100 mg daily, omeprazole 20 mg daily, Spiriva inhaler twice a day, tramadol 50 mg two to three times a day.  FAMILY HISTORY:                                            Parents have passed on.  There is a family history of diabetes and hypertension.  SOCIAL HISTORY:  The patient is married.  She is retired.  She smokes 1/2 pack a day.  She has been smoking for 55 years.  She does not drink alcoholic beverages.  She denies history of substance abuse.  PHYSICAL EXAMINATION:                                The patient is a well-developed, well-nourished white female in no acute distress.  Pulse 70, blood pressure 110/69.  Height 5 feet 6 inches, weight 177 pounds, BMI 28.5.  Marland Kitchen Respiratory - Lungs have inspiratory wheezes.  Symmetrical respiratory excursions.  . Cardiovascular - Heart regular rate and rhythm.  Normal S1, S2.  There is no murmur.    . Extremities - Extremity examination shows mild clubbing.  There is no cyanosis or edema.    . Musculoskeletal Examination - Musculoskeletal examination shows mild tenderness to palpation in the midlumbar region.  She is limited in flexion at about 80 degrees due to pain.  She is able to extend to about 10 degrees.  Straight leg raising is negative bilaterally.  NEUROLOGICAL EXAMINATION:                       Neurological examination shows 5/5 strength through the lower  extremities, including the iliopsoas, quadriceps, dorsiflexor, extensor hallucis longus and plantar flexor bilaterally.     . Sensory Examination - Sensation is intact to pinprick through the distal lower extremities.   . Deep Tendon Reflexes - Reflexes are trace to 1 at the quadriceps, absent at the gastrocnemii.  They are symmetric bilaterally.  Toes are downgoing bilaterally.   . Cerebellar Examination - She has a normal gait and stance.  DIAGNOSTIC STUDIES:                                    X-rays were done today at the office.  Her previous MRI from [facility] in 2013 was on a CD-Rom and is unavailable.  Study:  Lumbar spine x-rays.  Views:  AP, lateral, flexion/extension, obliques, cone-dome lumbosacral junction and AP thoracic (seven lumbar, one thoracic view).  Indications:  Low back and right lumbar radicular pain.  Findings:  Again noted is only 11 sets of ribs with five lumbar-type vertebrae numbered T12 to L4 and L5 is sacralized.  There is mild degenerative disk disease and spondylosis but the alignment is good and is stable through flexion and extension.  IMPRESSION:                                                   The patient with increasing low back pain with some right lumbar radicular discomfort.  Intact strength and sensation, but discomfort on flexion beyond 80 degrees.  RECOMMENDATIONS:                                      I discussed my assessment and impression with the patient and her daughter who accompanied her throughout the appointment today and reviewed her x-rays with them.  I have recommended that we reassess her low back.  Her last MRI was done three  years ago and is not available for review.  I have recommended an MRI of the lumbar spin without and with gadolinium and have her return to the office after it is completed and be able to give her further recommendations.

## 2015-07-10 NOTE — Anesthesia Procedure Notes (Signed)
Procedure Name: LMA Insertion Date/Time: 07/10/2015 1:03 PM Performed by: Layla Maw Pre-anesthesia Checklist: Patient identified, Patient being monitored, Timeout performed, Emergency Drugs available and Suction available Patient Re-evaluated:Patient Re-evaluated prior to inductionOxygen Delivery Method: Circle System Utilized Preoxygenation: Pre-oxygenation with 100% oxygen Intubation Type: IV induction Ventilation: Mask ventilation without difficulty LMA: LMA inserted LMA Size: 4.0 Number of attempts: 1 Placement Confirmation: positive ETCO2 and breath sounds checked- equal and bilateral Tube secured with: Tape Dental Injury: Teeth and Oropharynx as per pre-operative assessment

## 2015-07-10 NOTE — Progress Notes (Signed)
Dr Trena Platt office called for H&P. His office will fax to [place].

## 2015-07-10 NOTE — Anesthesia Postprocedure Evaluation (Signed)
  Anesthesia Post-op Note  Patient: Jean Taylor  Procedure(s) Performed: Procedure(s): MRI (N/A)  Patient Location: PACU  Anesthesia Type:General  Level of Consciousness: awake and alert   Airway and Oxygen Therapy: Patient Spontanous Breathing  Post-op Pain: none  Post-op Assessment: Post-op Vital signs reviewed              Post-op Vital Signs: Reviewed  Last Vitals:  Filed Vitals:   07/10/15 1445  BP: 104/89  Pulse: 60  Temp:   Resp:     Complications: No apparent anesthesia complications

## 2015-07-11 ENCOUNTER — Encounter (HOSPITAL_COMMUNITY): Payer: Self-pay | Admitting: Radiology

## 2015-07-11 MED FILL — Lidocaine HCl IV Inj 20 MG/ML: INTRAVENOUS | Qty: 5 | Status: AC

## 2015-07-11 MED FILL — Midazolam HCl Inj 2 MG/2ML (Base Equivalent): INTRAMUSCULAR | Qty: 2 | Status: AC

## 2015-07-11 MED FILL — Phenylephrine-NaCl Pref Syr 0.4 MG/10ML-0.9% (40 MCG/ML): INTRAVENOUS | Qty: 10 | Status: AC

## 2015-07-11 MED FILL — Lactated Ringer's Solution: INTRAVENOUS | Qty: 1000 | Status: AC

## 2015-07-11 MED FILL — Propofol IV Emul 200 MG/20ML (10 MG/ML): INTRAVENOUS | Qty: 20 | Status: AC

## 2015-08-27 DIAGNOSIS — M5116 Intervertebral disc disorders with radiculopathy, lumbar region: Secondary | ICD-10-CM | POA: Diagnosis not present

## 2015-08-27 DIAGNOSIS — M5136 Other intervertebral disc degeneration, lumbar region: Secondary | ICD-10-CM | POA: Diagnosis not present

## 2015-08-27 DIAGNOSIS — M4726 Other spondylosis with radiculopathy, lumbar region: Secondary | ICD-10-CM | POA: Diagnosis not present

## 2015-09-16 DIAGNOSIS — R2681 Unsteadiness on feet: Secondary | ICD-10-CM | POA: Diagnosis not present

## 2015-09-16 DIAGNOSIS — L570 Actinic keratosis: Secondary | ICD-10-CM | POA: Diagnosis not present

## 2015-09-16 DIAGNOSIS — M1990 Unspecified osteoarthritis, unspecified site: Secondary | ICD-10-CM | POA: Diagnosis not present

## 2015-09-16 DIAGNOSIS — M069 Rheumatoid arthritis, unspecified: Secondary | ICD-10-CM | POA: Diagnosis not present

## 2015-10-16 DIAGNOSIS — R6889 Other general symptoms and signs: Secondary | ICD-10-CM | POA: Diagnosis not present

## 2015-10-16 DIAGNOSIS — Z6829 Body mass index (BMI) 29.0-29.9, adult: Secondary | ICD-10-CM | POA: Diagnosis not present

## 2015-10-16 DIAGNOSIS — F17200 Nicotine dependence, unspecified, uncomplicated: Secondary | ICD-10-CM | POA: Diagnosis not present

## 2015-10-16 DIAGNOSIS — J069 Acute upper respiratory infection, unspecified: Secondary | ICD-10-CM | POA: Diagnosis not present

## 2015-10-16 DIAGNOSIS — R350 Frequency of micturition: Secondary | ICD-10-CM | POA: Diagnosis not present

## 2015-11-04 DIAGNOSIS — J3081 Allergic rhinitis due to animal (cat) (dog) hair and dander: Secondary | ICD-10-CM | POA: Diagnosis not present

## 2015-11-04 DIAGNOSIS — J4520 Mild intermittent asthma, uncomplicated: Secondary | ICD-10-CM | POA: Diagnosis not present

## 2015-11-04 DIAGNOSIS — J3089 Other allergic rhinitis: Secondary | ICD-10-CM | POA: Diagnosis not present

## 2015-11-04 DIAGNOSIS — J301 Allergic rhinitis due to pollen: Secondary | ICD-10-CM | POA: Diagnosis not present

## 2015-11-07 DIAGNOSIS — I10 Essential (primary) hypertension: Secondary | ICD-10-CM | POA: Diagnosis not present

## 2015-11-07 DIAGNOSIS — M5126 Other intervertebral disc displacement, lumbar region: Secondary | ICD-10-CM | POA: Diagnosis not present

## 2015-11-07 DIAGNOSIS — M5136 Other intervertebral disc degeneration, lumbar region: Secondary | ICD-10-CM | POA: Diagnosis not present

## 2015-11-07 DIAGNOSIS — Z6829 Body mass index (BMI) 29.0-29.9, adult: Secondary | ICD-10-CM | POA: Diagnosis not present

## 2015-11-07 DIAGNOSIS — M4726 Other spondylosis with radiculopathy, lumbar region: Secondary | ICD-10-CM | POA: Diagnosis not present

## 2015-11-07 DIAGNOSIS — M5416 Radiculopathy, lumbar region: Secondary | ICD-10-CM | POA: Diagnosis not present

## 2015-11-07 DIAGNOSIS — M5440 Lumbago with sciatica, unspecified side: Secondary | ICD-10-CM | POA: Diagnosis not present

## 2015-11-10 DIAGNOSIS — J301 Allergic rhinitis due to pollen: Secondary | ICD-10-CM | POA: Diagnosis not present

## 2015-11-10 DIAGNOSIS — J3089 Other allergic rhinitis: Secondary | ICD-10-CM | POA: Diagnosis not present

## 2015-11-20 DIAGNOSIS — M5136 Other intervertebral disc degeneration, lumbar region: Secondary | ICD-10-CM | POA: Diagnosis not present

## 2015-11-20 DIAGNOSIS — M5116 Intervertebral disc disorders with radiculopathy, lumbar region: Secondary | ICD-10-CM | POA: Diagnosis not present

## 2015-11-20 DIAGNOSIS — M4726 Other spondylosis with radiculopathy, lumbar region: Secondary | ICD-10-CM | POA: Diagnosis not present

## 2015-11-20 DIAGNOSIS — M47816 Spondylosis without myelopathy or radiculopathy, lumbar region: Secondary | ICD-10-CM | POA: Diagnosis not present

## 2015-11-20 DIAGNOSIS — M5441 Lumbago with sciatica, right side: Secondary | ICD-10-CM | POA: Diagnosis not present

## 2015-12-04 DIAGNOSIS — R0781 Pleurodynia: Secondary | ICD-10-CM | POA: Diagnosis not present

## 2015-12-04 DIAGNOSIS — R0789 Other chest pain: Secondary | ICD-10-CM | POA: Diagnosis not present

## 2015-12-04 DIAGNOSIS — F17200 Nicotine dependence, unspecified, uncomplicated: Secondary | ICD-10-CM | POA: Diagnosis not present

## 2015-12-04 DIAGNOSIS — J449 Chronic obstructive pulmonary disease, unspecified: Secondary | ICD-10-CM | POA: Diagnosis not present

## 2015-12-18 DIAGNOSIS — J309 Allergic rhinitis, unspecified: Secondary | ICD-10-CM | POA: Diagnosis not present

## 2015-12-18 DIAGNOSIS — E079 Disorder of thyroid, unspecified: Secondary | ICD-10-CM | POA: Diagnosis not present

## 2015-12-18 DIAGNOSIS — J449 Chronic obstructive pulmonary disease, unspecified: Secondary | ICD-10-CM | POA: Diagnosis not present

## 2015-12-18 DIAGNOSIS — F5101 Primary insomnia: Secondary | ICD-10-CM | POA: Diagnosis not present

## 2015-12-18 DIAGNOSIS — I10 Essential (primary) hypertension: Secondary | ICD-10-CM | POA: Diagnosis not present

## 2015-12-24 DIAGNOSIS — J309 Allergic rhinitis, unspecified: Secondary | ICD-10-CM | POA: Diagnosis not present

## 2015-12-30 DIAGNOSIS — J309 Allergic rhinitis, unspecified: Secondary | ICD-10-CM | POA: Diagnosis not present

## 2016-01-05 DIAGNOSIS — E042 Nontoxic multinodular goiter: Secondary | ICD-10-CM | POA: Diagnosis not present

## 2016-01-05 DIAGNOSIS — E011 Iodine-deficiency related multinodular (endemic) goiter: Secondary | ICD-10-CM | POA: Diagnosis not present

## 2016-01-09 DIAGNOSIS — J309 Allergic rhinitis, unspecified: Secondary | ICD-10-CM | POA: Diagnosis not present

## 2016-01-15 DIAGNOSIS — M791 Myalgia: Secondary | ICD-10-CM | POA: Diagnosis not present

## 2016-01-15 DIAGNOSIS — I10 Essential (primary) hypertension: Secondary | ICD-10-CM | POA: Diagnosis not present

## 2016-01-15 DIAGNOSIS — J309 Allergic rhinitis, unspecified: Secondary | ICD-10-CM | POA: Diagnosis not present

## 2016-01-15 DIAGNOSIS — M3500 Sicca syndrome, unspecified: Secondary | ICD-10-CM | POA: Diagnosis not present

## 2016-01-16 DIAGNOSIS — J449 Chronic obstructive pulmonary disease, unspecified: Secondary | ICD-10-CM | POA: Diagnosis not present

## 2016-01-16 DIAGNOSIS — I10 Essential (primary) hypertension: Secondary | ICD-10-CM | POA: Diagnosis not present

## 2016-01-16 DIAGNOSIS — M069 Rheumatoid arthritis, unspecified: Secondary | ICD-10-CM | POA: Diagnosis not present

## 2016-01-21 DIAGNOSIS — J309 Allergic rhinitis, unspecified: Secondary | ICD-10-CM | POA: Diagnosis not present

## 2016-01-26 DIAGNOSIS — J309 Allergic rhinitis, unspecified: Secondary | ICD-10-CM | POA: Diagnosis not present

## 2016-01-30 DIAGNOSIS — J449 Chronic obstructive pulmonary disease, unspecified: Secondary | ICD-10-CM | POA: Diagnosis not present

## 2016-01-30 DIAGNOSIS — R3915 Urgency of urination: Secondary | ICD-10-CM | POA: Diagnosis not present

## 2016-01-30 DIAGNOSIS — I10 Essential (primary) hypertension: Secondary | ICD-10-CM | POA: Diagnosis not present

## 2016-01-30 DIAGNOSIS — R5383 Other fatigue: Secondary | ICD-10-CM | POA: Diagnosis not present

## 2016-02-03 DIAGNOSIS — J309 Allergic rhinitis, unspecified: Secondary | ICD-10-CM | POA: Diagnosis not present

## 2016-02-13 DIAGNOSIS — J449 Chronic obstructive pulmonary disease, unspecified: Secondary | ICD-10-CM | POA: Diagnosis not present

## 2016-02-13 DIAGNOSIS — M069 Rheumatoid arthritis, unspecified: Secondary | ICD-10-CM | POA: Diagnosis not present

## 2016-02-13 DIAGNOSIS — I10 Essential (primary) hypertension: Secondary | ICD-10-CM | POA: Diagnosis not present

## 2016-02-18 DIAGNOSIS — R3911 Hesitancy of micturition: Secondary | ICD-10-CM | POA: Diagnosis not present

## 2016-02-18 DIAGNOSIS — R413 Other amnesia: Secondary | ICD-10-CM | POA: Diagnosis not present

## 2016-02-18 DIAGNOSIS — R5383 Other fatigue: Secondary | ICD-10-CM | POA: Diagnosis not present

## 2016-03-02 DIAGNOSIS — R0602 Shortness of breath: Secondary | ICD-10-CM | POA: Diagnosis not present

## 2016-03-08 DIAGNOSIS — R0602 Shortness of breath: Secondary | ICD-10-CM | POA: Diagnosis not present

## 2016-03-11 DIAGNOSIS — M069 Rheumatoid arthritis, unspecified: Secondary | ICD-10-CM | POA: Diagnosis not present

## 2016-03-11 DIAGNOSIS — J449 Chronic obstructive pulmonary disease, unspecified: Secondary | ICD-10-CM | POA: Diagnosis not present

## 2016-03-11 DIAGNOSIS — Z299 Encounter for prophylactic measures, unspecified: Secondary | ICD-10-CM | POA: Diagnosis not present

## 2016-03-11 DIAGNOSIS — I10 Essential (primary) hypertension: Secondary | ICD-10-CM | POA: Diagnosis not present

## 2016-03-15 ENCOUNTER — Encounter: Payer: Self-pay | Admitting: *Deleted

## 2016-03-16 ENCOUNTER — Encounter: Payer: Self-pay | Admitting: *Deleted

## 2016-03-16 ENCOUNTER — Encounter: Payer: Self-pay | Admitting: Cardiology

## 2016-03-16 ENCOUNTER — Ambulatory Visit (INDEPENDENT_AMBULATORY_CARE_PROVIDER_SITE_OTHER): Payer: Medicare Other | Admitting: Cardiology

## 2016-03-16 VITALS — BP 115/70 | HR 67 | Ht 66.0 in | Wt 176.0 lb

## 2016-03-16 DIAGNOSIS — R0602 Shortness of breath: Secondary | ICD-10-CM | POA: Diagnosis not present

## 2016-03-16 DIAGNOSIS — R079 Chest pain, unspecified: Secondary | ICD-10-CM

## 2016-03-16 DIAGNOSIS — R5383 Other fatigue: Secondary | ICD-10-CM | POA: Diagnosis not present

## 2016-03-16 NOTE — Patient Instructions (Signed)
Your physician recommends that you schedule a follow-up appointment in: 4-6 weeks with Dr. Harl Bowie   Your physician recommends that you continue on your current medications as directed. Please refer to the Current Medication list given to you today.  Your physician has requested that you have a lexiscan myoview. For further information please visit [email or web address]. Please follow instruction sheet, as given.  Thank you for choosing [facility]!!

## 2016-03-16 NOTE — Progress Notes (Signed)
Clinical Summary Jean Taylor is a 75 y.o.female seen today as a new patient, she is referred by Dr Manuella Ghazi.   1. SOB/Chest pain - SOB started  several months ago, recently has been progressing. Mainly occurs with exertion, example walking dog or to mailbox. Used to do fairly comfortably. Can have some occasional chest pain. Tends to  Sharp pain midchest and left chest, 5/10 in severity. Lasts about 1 minute. Occurs 2-3 times a week. Can have intermittent feeling of heavy weight on chest as well. No relation to food. Not positional.  - 02/2016 echo LVEF 00-93%, grade I diastolic dysfunction - she reports history of lupus, firbomyalgia as well.   CAD risk factors: +tobacco x 55 years, HTN    2. COPD - compliant with inhalers.  - can have some coughing, wheezing at times     SH: husband is Richard Miu, patient of mine.  Past Medical History:  Diagnosis Date  . Anxiety   . Arthritis   . Asthma   . Blood transfusion without reported diagnosis   . Breast calcification, right 02/28/2013   Excised 03/08/13:B9 on path   . COPD (chronic obstructive pulmonary disease) ([place])   . Fibromyalgia   . GERD (gastroesophageal reflux disease)   . Hypertension   . Hypothyroidism   . IBS (irritable bowel syndrome)   . Lupus ([place])   . Neuromuscular disorder ([place])   . Pneumonia   . Rheumatoid arthritis ([place])   . RLS (restless legs syndrome)   . Thyroid disease   . Wears dentures    top-lower partial     Allergies  Allergen Reactions  . Demerol [Meperidine]     Hallucinations  . Morphine And Related Nausea And Vomiting     Current Outpatient Prescriptions  Medication Sig Dispense Refill  . albuterol (PROVENTIL HFA;VENTOLIN HFA) 108 (90 BASE) MCG/ACT inhaler Inhale 2 puffs into the lungs every 6 (six) hours as needed for wheezing.    Marland Kitchen amLODipine (NORVASC) 5 MG tablet Take 5 mg by mouth daily.    Marland Kitchen aspirin 81 MG tablet Take 81 mg by mouth daily.    Marland Kitchen doxepin (SINEQUAN) 50 MG capsule  Take 100 mg by mouth at bedtime.     Marland Kitchen EXFORGE 5-160 MG per tablet 1 tablet daily.     Marland Kitchen gabapentin (NEURONTIN) 300 MG capsule 300 mg 2 (two) times daily. Takes 1 at 2 pm and 1 at bedtime    . HYDROcodone-acetaminophen (NORCO) 5-325 MG per tablet Take 1 tablet by mouth every 4 (four) hours as needed for pain. (Patient not taking: Reported on 07/09/2015) 30 tablet 0  . levothyroxine (SYNTHROID, LEVOTHROID) 50 MCG tablet 50 mcg daily before breakfast.     . LORazepam (ATIVAN) 1 MG tablet Take 0.5-1 mg by mouth 2 (two) times daily. Takes '1mg'$  in AM and if anxious in afternoon will taken 0.'5mg'$     . losartan (COZAAR) 100 MG tablet Take 100 mg by mouth daily.    . meclizine (ANTIVERT) 25 MG tablet     . metoprolol tartrate (LOPRESSOR) 25 MG tablet Take 25 mg by mouth daily.    . Multiple Vitamins-Minerals (MULTIVITAMIN WITH MINERALS) tablet Take 1 tablet by mouth daily.    Marland Kitchen omeprazole (PRILOSEC) 20 MG capsule Take 20 mg by mouth daily.     . theophylline (THEODUR) 200 MG 12 hr tablet     . tiotropium (SPIRIVA) 18 MCG inhalation capsule Place 18 mcg into inhaler and inhale daily.    Marland Kitchen  traMADol (ULTRAM) 50 MG tablet 50-100 mg every 12 (twelve) hours as needed (can take 2 tablets three times a day if needed. Pt takes 1 daily (sometimes will take 2 if needed)).     . triamcinolone cream (KENALOG) 0.1 %      No current facility-administered medications for this visit.      Past Surgical History:  Procedure Laterality Date  . ABDOMINAL HYSTERECTOMY    . BREAST BIOPSY Right 03/08/2013   Procedure:  NEEDLE LOCALIZATION REMOVAL RIGHT BREAST CALCIFICATIONS;  Surgeon: Haywood Lasso, MD;  Location: [facility];  Service: General;  Laterality: Right;  . CHOLECYSTECTOMY    . EYE SURGERY Bilateral    cataract surgery with lens implant  . KNEE ARTHROSCOPY Left   . MINIMALLY INVASIVE MICRODISCECTOMY LUMBAR SPINE    . NASAL SEPTUM SURGERY    . RADIOLOGY WITH ANESTHESIA N/A 07/10/2015    Procedure: MRI;  Surgeon: Medication Radiologist, MD;  Location: [facility];  Service: Radiology;  Laterality: N/A;     Allergies  Allergen Reactions  . Demerol [Meperidine]     Hallucinations  . Morphine And Related Nausea And Vomiting      Family History  Problem Relation Age of Onset  . Cancer Mother     breast     Social History Ms. Rathod reports that she has been smoking.  She has been smoking about 0.50 packs per day. She has never used smokeless tobacco. Ms. Robideau reports that she does not drink alcohol.   Review of Systems CONSTITUTIONAL: No weight loss, fever, chills, weakness or fatigue.  HEENT: Eyes: No visual loss, blurred vision, double vision or yellow sclerae.No hearing loss, sneezing, congestion, runny nose or sore throat.  SKIN: No rash or itching.  CARDIOVASCULAR: per HPI RESPIRATORY: per HPI GASTROINTESTINAL: No anorexia, nausea, vomiting or diarrhea. No abdominal pain or blood.  GENITOURINARY: No burning on urination, no polyuria NEUROLOGICAL: No headache, dizziness, syncope, paralysis, ataxia, numbness or tingling in the extremities. No change in bowel or bladder control.  MUSCULOSKELETAL: No muscle, back pain, joint pain or stiffness.  LYMPHATICS: No enlarged nodes. No history of splenectomy.  PSYCHIATRIC: No history of depression or anxiety.  ENDOCRINOLOGIC: No reports of sweating, cold or heat intolerance. No polyuria or polydipsia.  Marland Kitchen   Physical Examination Vitals:   03/16/16 1010  BP: 115/70  Pulse: 67   Vitals:   03/16/16 1010  Weight: 176 lb (79.8 kg)  Height: '5\' 6"'$  (1.676 m)    Gen: resting comfortably, no acute distress HEENT: no scleral icterus, pupils equal round and reactive, no palptable cervical adenopathy,  CV: RRR, no m/r/g, no jvd Resp: Clear to auscultation bilaterally GI: abdomen is soft, non-tender, non-distended, normal bowel sounds, no hepatosplenomegaly MSK: extremities are warm, no edema.  Skin: warm, no rash Neuro:   no focal deficits Psych: appropriate affect       Assessment and Plan   1. Chest pain/DOE/SOB - unclear etiology. EKG in clinic shows NSR. Recent echo at PCP with normal LVEF, grade I diastolic dysfunction  we will obtain lexiscan MPI to evaluate for coronary ischemia   F/u 4 weeks.      Arnoldo Lenis, M.D.

## 2016-03-17 DIAGNOSIS — J449 Chronic obstructive pulmonary disease, unspecified: Secondary | ICD-10-CM | POA: Diagnosis not present

## 2016-03-17 DIAGNOSIS — M069 Rheumatoid arthritis, unspecified: Secondary | ICD-10-CM | POA: Diagnosis not present

## 2016-03-17 DIAGNOSIS — I10 Essential (primary) hypertension: Secondary | ICD-10-CM | POA: Diagnosis not present

## 2016-03-23 ENCOUNTER — Inpatient Hospital Stay (HOSPITAL_COMMUNITY): Admission: RE | Admit: 2016-03-23 | Payer: Medicare Other | Source: Ambulatory Visit

## 2016-03-23 ENCOUNTER — Encounter (HOSPITAL_COMMUNITY)
Admission: RE | Admit: 2016-03-23 | Discharge: 2016-03-23 | Disposition: A | Discharge status: Home / Self Care | Payer: Medicare Other | Source: Ambulatory Visit | Attending: Cardiology | Admitting: Cardiology

## 2016-03-23 ENCOUNTER — Encounter (HOSPITAL_COMMUNITY): Payer: Self-pay

## 2016-03-23 DIAGNOSIS — I510 Cardiac septal defect, acquired: Secondary | ICD-10-CM | POA: Diagnosis not present

## 2016-03-23 DIAGNOSIS — R079 Chest pain, unspecified: Secondary | ICD-10-CM

## 2016-03-23 LAB — NM MYOCAR MULTI W/SPECT W/WALL MOTION / EF
CHL CUP NUCLEAR SDS: 1
CHL CUP RESTING HR STRESS: 62 {beats}/min
LV dias vol: 47 mL (ref 46–106)
LVSYSVOL: 14 mL
Peak HR: 77 {beats}/min
RATE: 0.28
SRS: 1
SSS: 2
TID: 1.1

## 2016-03-23 MED ORDER — TECHNETIUM TC 99M TETROFOSMIN IV KIT
10.0000 | PACK | Freq: Once | INTRAVENOUS | Status: AC | PRN
  Administered 2016-03-23: 10 via INTRAVENOUS

## 2016-03-23 MED ORDER — SODIUM CHLORIDE 0.9% FLUSH
INTRAVENOUS | Status: AC
  Administered 2016-03-23: 10 mL via INTRAVENOUS
  Filled 2016-03-23: qty 10

## 2016-03-23 MED ORDER — REGADENOSON 0.4 MG/5ML IV SOLN
INTRAVENOUS | Status: AC
  Administered 2016-03-23: 0.4 mg via INTRAVENOUS
  Filled 2016-03-23: qty 5

## 2016-03-23 MED ORDER — TECHNETIUM TC 99M TETROFOSMIN IV KIT
30.0000 | PACK | Freq: Once | INTRAVENOUS | Status: AC | PRN
  Administered 2016-03-23: 30 via INTRAVENOUS

## 2016-03-24 ENCOUNTER — Telehealth: Payer: Self-pay | Admitting: *Deleted

## 2016-03-24 NOTE — Telephone Encounter (Signed)
Pt aware, confirmed 8/23 appt - routed to pcp

## 2016-03-24 NOTE — Telephone Encounter (Signed)
-----   Message from Arnoldo Lenis, MD sent at 03/24/2016 12:40 PM EDT ----- Stress test overall looks good, no significant evidence of blockages. We will discuss in detail at our follow up  Zandra Abts MD

## 2016-04-14 ENCOUNTER — Ambulatory Visit (INDEPENDENT_AMBULATORY_CARE_PROVIDER_SITE_OTHER): Payer: Medicare Other | Admitting: Cardiology

## 2016-04-14 ENCOUNTER — Encounter: Payer: Self-pay | Admitting: Cardiology

## 2016-04-14 VITALS — BP 121/73 | HR 69 | Ht 66.0 in | Wt 179.0 lb

## 2016-04-14 DIAGNOSIS — R0789 Other chest pain: Secondary | ICD-10-CM | POA: Diagnosis not present

## 2016-04-14 NOTE — Patient Instructions (Signed)

## 2016-04-14 NOTE — Progress Notes (Signed)
Clinical Summary Ms. Putnam is a 75 y.o.female seen today for follow up of the following medical problems.   1. SOB/Chest pain - SOB started  several months ago, recently has been progressing. Mainly occurs with exertion, example walking dog or to mailbox. Used to do fairly comfortably. Can have some occasional chest pain. Sharp pain midchest and left chest, 5/10 in severity. Lasts about 1 minute. Occurs 2-3 times a week. Can have intermittent feeling of heavy weight on chest as well. No relation to food. Not positional.  - 02/2016 echo LVEF 05-39%, grade I diastolic dysfunction - she reports history of lupus, firbomyalgia as well.  -CAD risk factors: +tobacco x 55 years, HTN  - 03/2016 MPI with probable breast attenuation, less likely small area of ischemia.  - no recurrent symptoms since last visit. She has increased her hydration and is feeling very well from an energy standpoint.   2. COPD - compliant with inhalers.      SH: husband is Telesha Deguzman, patient of mine.  Past Medical History:  Diagnosis Date  . Anxiety   . Arthritis   . Asthma   . Blood transfusion without reported diagnosis   . Breast calcification, right 02/28/2013   Excised 03/08/13:B9 on path   . COPD (chronic obstructive pulmonary disease) ([place])   . Fibromyalgia   . GERD (gastroesophageal reflux disease)   . Hypertension   . Hypothyroidism   . IBS (irritable bowel syndrome)   . Lupus ([place])   . Neuromuscular disorder ([place])   . Pneumonia   . Rheumatoid arthritis ([place])   . RLS (restless legs syndrome)   . Thyroid disease   . Wears dentures    top-lower partial     Allergies  Allergen Reactions  . Demerol [Meperidine]     Hallucinations  . Morphine And Related Nausea And Vomiting     Current Outpatient Prescriptions  Medication Sig Dispense Refill  . albuterol (PROVENTIL HFA;VENTOLIN HFA) 108 (90 BASE) MCG/ACT inhaler Inhale 2 puffs into the lungs every 6 (six) hours as needed for  wheezing.    Marland Kitchen amLODipine (NORVASC) 5 MG tablet Take 5 mg by mouth daily.    Marland Kitchen aspirin 81 MG tablet Take 81 mg by mouth daily.    Marland Kitchen doxepin (SINEQUAN) 50 MG capsule Take 100 mg by mouth at bedtime.     . gabapentin (NEURONTIN) 300 MG capsule 300 mg 2 (two) times daily. Takes 1 at 2 pm and 1 at bedtime    . levothyroxine (SYNTHROID, LEVOTHROID) 50 MCG tablet 50 mcg daily before breakfast.     . LORazepam (ATIVAN) 1 MG tablet Take 0.5-1 mg by mouth 2 (two) times daily. Takes '1mg'$  in AM and if anxious in afternoon will taken 0.'5mg'$     . losartan (COZAAR) 100 MG tablet Take 100 mg by mouth daily.    . magnesium gluconate (MAGONATE) 500 MG tablet Take 500 mg by mouth daily.    . Multiple Vitamins-Minerals (MULTIVITAMIN WITH MINERALS) tablet Take 1 tablet by mouth daily.    Marland Kitchen omeprazole (PRILOSEC) 20 MG capsule Take 20 mg by mouth daily.     Marland Kitchen tiotropium (SPIRIVA) 18 MCG inhalation capsule Place 18 mcg into inhaler and inhale daily.    . traMADol (ULTRAM) 50 MG tablet 50-100 mg every 12 (twelve) hours as needed (can take 2 tablets three times a day if needed. Pt takes 1 daily (sometimes will take 2 if needed)).      No current facility-administered  medications for this visit.      Past Surgical History:  Procedure Laterality Date  . ABDOMINAL HYSTERECTOMY    . BREAST BIOPSY Right 03/08/2013   Procedure:  NEEDLE LOCALIZATION REMOVAL RIGHT BREAST CALCIFICATIONS;  Surgeon: Haywood Lasso, MD;  Location: [facility];  Service: General;  Laterality: Right;  . CHOLECYSTECTOMY    . EYE SURGERY Bilateral    cataract surgery with lens implant  . KNEE ARTHROSCOPY Left   . MINIMALLY INVASIVE MICRODISCECTOMY LUMBAR SPINE    . NASAL SEPTUM SURGERY    . RADIOLOGY WITH ANESTHESIA N/A 07/10/2015   Procedure: MRI;  Surgeon: Medication Radiologist, MD;  Location: [facility];  Service: Radiology;  Laterality: N/A;     Allergies  Allergen Reactions  . Demerol [Meperidine]     Hallucinations  .  Morphine And Related Nausea And Vomiting      Family History  Problem Relation Age of Onset  . Cancer Mother     breast     Social History Ms. Broz reports that she has been smoking.  She has been smoking about 0.50 packs per day. She has never used smokeless tobacco. Ms. Halvorson reports that she does not drink alcohol.   Review of Systems CONSTITUTIONAL: No weight loss, fever, chills, weakness or fatigue.  HEENT: Eyes: No visual loss, blurred vision, double vision or yellow sclerae.No hearing loss, sneezing, congestion, runny nose or sore throat.  SKIN: No rash or itching.  CARDIOVASCULAR: per HPI RESPIRATORY: No shortness of breath, cough or sputum.  GASTROINTESTINAL: No anorexia, nausea, vomiting or diarrhea. No abdominal pain or blood.  GENITOURINARY: No burning on urination, no polyuria NEUROLOGICAL: No headache, dizziness, syncope, paralysis, ataxia, numbness or tingling in the extremities. No change in bowel or bladder control.  MUSCULOSKELETAL: No muscle, back pain, joint pain or stiffness.  LYMPHATICS: No enlarged nodes. No history of splenectomy.  PSYCHIATRIC: No history of depression or anxiety.  ENDOCRINOLOGIC: No reports of sweating, cold or heat intolerance. No polyuria or polydipsia.  Marland Kitchen   Physical Examination Vitals:   04/14/16 1050  BP: 121/73  Pulse: 69   Vitals:   04/14/16 1050  Weight: 179 lb (81.2 kg)  Height: '5\' 6"'$  (1.676 m)    Gen: resting comfortably, no acute distress HEENT: no scleral icterus, pupils equal round and reactive, no palptable cervical adenopathy,  CV: RRR, no m/r/g, no jvd Resp: Clear to auscultation bilaterally GI: abdomen is soft, non-tender, non-distended, normal bowel sounds, no hepatosplenomegaly MSK: extremities are warm, no edema.  Skin: warm, no rash Neuro:  no focal deficits Psych: appropriate affect   Diagnostic Studies  03/2016 MPI  No diagnostic ST segment changes to indicate ischemia.  Small, mild  intensity, partially reversible mid anterior defect with fixed apical anteroseptal defect. This is suggestive of variable breast attenuation artifact, less likely a small region of ischemia.  This is a low risk study.  Nuclear stress EF: 70%.   Assessment and Plan   1. Chest pain/DOE/SOB - symptoms overall resolved since last visit. Stress test without clear evidence of ischemia - no further cardiac workup at this time, continue to follow clinically   F/u 6 months  Arnoldo Lenis, M.D.

## 2016-05-07 DIAGNOSIS — J449 Chronic obstructive pulmonary disease, unspecified: Secondary | ICD-10-CM | POA: Diagnosis not present

## 2016-05-07 DIAGNOSIS — M069 Rheumatoid arthritis, unspecified: Secondary | ICD-10-CM | POA: Diagnosis not present

## 2016-05-07 DIAGNOSIS — I10 Essential (primary) hypertension: Secondary | ICD-10-CM | POA: Diagnosis not present

## 2016-06-03 DIAGNOSIS — Z23 Encounter for immunization: Secondary | ICD-10-CM | POA: Diagnosis not present

## 2016-07-06 DIAGNOSIS — I10 Essential (primary) hypertension: Secondary | ICD-10-CM | POA: Diagnosis not present

## 2016-07-06 DIAGNOSIS — M069 Rheumatoid arthritis, unspecified: Secondary | ICD-10-CM | POA: Diagnosis not present

## 2016-07-06 DIAGNOSIS — J449 Chronic obstructive pulmonary disease, unspecified: Secondary | ICD-10-CM | POA: Diagnosis not present

## 2016-08-02 DIAGNOSIS — I10 Essential (primary) hypertension: Secondary | ICD-10-CM | POA: Diagnosis not present

## 2016-08-02 DIAGNOSIS — J449 Chronic obstructive pulmonary disease, unspecified: Secondary | ICD-10-CM | POA: Diagnosis not present

## 2016-08-02 DIAGNOSIS — M069 Rheumatoid arthritis, unspecified: Secondary | ICD-10-CM | POA: Diagnosis not present

## 2016-08-12 DIAGNOSIS — J449 Chronic obstructive pulmonary disease, unspecified: Secondary | ICD-10-CM | POA: Diagnosis not present

## 2016-08-12 DIAGNOSIS — E039 Hypothyroidism, unspecified: Secondary | ICD-10-CM | POA: Diagnosis not present

## 2016-08-12 DIAGNOSIS — J069 Acute upper respiratory infection, unspecified: Secondary | ICD-10-CM | POA: Diagnosis not present

## 2016-08-12 DIAGNOSIS — I10 Essential (primary) hypertension: Secondary | ICD-10-CM | POA: Diagnosis not present

## 2016-08-12 DIAGNOSIS — F17200 Nicotine dependence, unspecified, uncomplicated: Secondary | ICD-10-CM | POA: Diagnosis not present

## 2016-08-12 DIAGNOSIS — Z6828 Body mass index (BMI) 28.0-28.9, adult: Secondary | ICD-10-CM | POA: Diagnosis not present

## 2016-08-12 DIAGNOSIS — Z299 Encounter for prophylactic measures, unspecified: Secondary | ICD-10-CM | POA: Diagnosis not present

## 2016-08-19 DIAGNOSIS — Z1231 Encounter for screening mammogram for malignant neoplasm of breast: Secondary | ICD-10-CM | POA: Diagnosis not present

## 2016-09-08 DIAGNOSIS — I10 Essential (primary) hypertension: Secondary | ICD-10-CM | POA: Diagnosis not present

## 2016-09-08 DIAGNOSIS — J449 Chronic obstructive pulmonary disease, unspecified: Secondary | ICD-10-CM | POA: Diagnosis not present

## 2016-09-08 DIAGNOSIS — M069 Rheumatoid arthritis, unspecified: Secondary | ICD-10-CM | POA: Diagnosis not present

## 2016-09-14 DIAGNOSIS — Z0000 Encounter for general adult medical examination without abnormal findings: Secondary | ICD-10-CM | POA: Diagnosis not present

## 2016-09-14 DIAGNOSIS — Z1211 Encounter for screening for malignant neoplasm of colon: Secondary | ICD-10-CM | POA: Diagnosis not present

## 2016-09-14 DIAGNOSIS — Z7189 Other specified counseling: Secondary | ICD-10-CM | POA: Diagnosis not present

## 2016-09-14 DIAGNOSIS — Z1389 Encounter for screening for other disorder: Secondary | ICD-10-CM | POA: Diagnosis not present

## 2016-09-14 DIAGNOSIS — Z299 Encounter for prophylactic measures, unspecified: Secondary | ICD-10-CM | POA: Diagnosis not present

## 2016-09-27 DIAGNOSIS — R5383 Other fatigue: Secondary | ICD-10-CM | POA: Diagnosis not present

## 2016-09-27 DIAGNOSIS — E039 Hypothyroidism, unspecified: Secondary | ICD-10-CM | POA: Diagnosis not present

## 2016-10-04 DIAGNOSIS — M3500 Sicca syndrome, unspecified: Secondary | ICD-10-CM | POA: Diagnosis not present

## 2016-10-04 DIAGNOSIS — Z6828 Body mass index (BMI) 28.0-28.9, adult: Secondary | ICD-10-CM | POA: Diagnosis not present

## 2016-10-04 DIAGNOSIS — M329 Systemic lupus erythematosus, unspecified: Secondary | ICD-10-CM | POA: Diagnosis not present

## 2016-10-04 DIAGNOSIS — Z299 Encounter for prophylactic measures, unspecified: Secondary | ICD-10-CM | POA: Diagnosis not present

## 2016-10-04 DIAGNOSIS — F17210 Nicotine dependence, cigarettes, uncomplicated: Secondary | ICD-10-CM | POA: Diagnosis not present

## 2016-10-04 DIAGNOSIS — J449 Chronic obstructive pulmonary disease, unspecified: Secondary | ICD-10-CM | POA: Diagnosis not present

## 2016-10-04 DIAGNOSIS — M069 Rheumatoid arthritis, unspecified: Secondary | ICD-10-CM | POA: Diagnosis not present

## 2016-10-04 DIAGNOSIS — J069 Acute upper respiratory infection, unspecified: Secondary | ICD-10-CM | POA: Diagnosis not present

## 2016-10-04 DIAGNOSIS — R7309 Other abnormal glucose: Secondary | ICD-10-CM | POA: Diagnosis not present

## 2016-10-18 DIAGNOSIS — I10 Essential (primary) hypertension: Secondary | ICD-10-CM | POA: Diagnosis not present

## 2016-10-18 DIAGNOSIS — M069 Rheumatoid arthritis, unspecified: Secondary | ICD-10-CM | POA: Diagnosis not present

## 2016-10-18 DIAGNOSIS — J449 Chronic obstructive pulmonary disease, unspecified: Secondary | ICD-10-CM | POA: Diagnosis not present

## 2016-11-15 DIAGNOSIS — J449 Chronic obstructive pulmonary disease, unspecified: Secondary | ICD-10-CM | POA: Diagnosis not present

## 2016-11-15 DIAGNOSIS — I10 Essential (primary) hypertension: Secondary | ICD-10-CM | POA: Diagnosis not present

## 2016-11-15 DIAGNOSIS — M069 Rheumatoid arthritis, unspecified: Secondary | ICD-10-CM | POA: Diagnosis not present

## 2016-11-25 DIAGNOSIS — J069 Acute upper respiratory infection, unspecified: Secondary | ICD-10-CM | POA: Diagnosis not present

## 2016-11-25 DIAGNOSIS — I10 Essential (primary) hypertension: Secondary | ICD-10-CM | POA: Diagnosis not present

## 2016-11-25 DIAGNOSIS — E039 Hypothyroidism, unspecified: Secondary | ICD-10-CM | POA: Diagnosis not present

## 2016-11-25 DIAGNOSIS — J449 Chronic obstructive pulmonary disease, unspecified: Secondary | ICD-10-CM | POA: Diagnosis not present

## 2016-11-25 DIAGNOSIS — Z299 Encounter for prophylactic measures, unspecified: Secondary | ICD-10-CM | POA: Diagnosis not present

## 2016-11-25 DIAGNOSIS — M069 Rheumatoid arthritis, unspecified: Secondary | ICD-10-CM | POA: Diagnosis not present

## 2016-11-25 DIAGNOSIS — Z6828 Body mass index (BMI) 28.0-28.9, adult: Secondary | ICD-10-CM | POA: Diagnosis not present

## 2017-01-12 DIAGNOSIS — Z299 Encounter for prophylactic measures, unspecified: Secondary | ICD-10-CM | POA: Diagnosis not present

## 2017-01-12 DIAGNOSIS — J069 Acute upper respiratory infection, unspecified: Secondary | ICD-10-CM | POA: Diagnosis not present

## 2017-01-12 DIAGNOSIS — Z713 Dietary counseling and surveillance: Secondary | ICD-10-CM | POA: Diagnosis not present

## 2017-01-12 DIAGNOSIS — Z6828 Body mass index (BMI) 28.0-28.9, adult: Secondary | ICD-10-CM | POA: Diagnosis not present

## 2017-01-12 DIAGNOSIS — F17210 Nicotine dependence, cigarettes, uncomplicated: Secondary | ICD-10-CM | POA: Diagnosis not present

## 2017-02-24 DIAGNOSIS — J449 Chronic obstructive pulmonary disease, unspecified: Secondary | ICD-10-CM | POA: Diagnosis not present

## 2017-02-24 DIAGNOSIS — F17210 Nicotine dependence, cigarettes, uncomplicated: Secondary | ICD-10-CM | POA: Diagnosis not present

## 2017-02-24 DIAGNOSIS — M069 Rheumatoid arthritis, unspecified: Secondary | ICD-10-CM | POA: Diagnosis not present

## 2017-02-24 DIAGNOSIS — R7309 Other abnormal glucose: Secondary | ICD-10-CM | POA: Diagnosis not present

## 2017-02-24 DIAGNOSIS — Z299 Encounter for prophylactic measures, unspecified: Secondary | ICD-10-CM | POA: Diagnosis not present

## 2017-02-24 DIAGNOSIS — E039 Hypothyroidism, unspecified: Secondary | ICD-10-CM | POA: Diagnosis not present

## 2017-02-24 DIAGNOSIS — M4800 Spinal stenosis, site unspecified: Secondary | ICD-10-CM | POA: Diagnosis not present

## 2017-02-24 DIAGNOSIS — N390 Urinary tract infection, site not specified: Secondary | ICD-10-CM | POA: Diagnosis not present

## 2017-02-24 DIAGNOSIS — F17200 Nicotine dependence, unspecified, uncomplicated: Secondary | ICD-10-CM | POA: Diagnosis not present

## 2017-02-24 DIAGNOSIS — I10 Essential (primary) hypertension: Secondary | ICD-10-CM | POA: Diagnosis not present

## 2017-02-24 DIAGNOSIS — Z6829 Body mass index (BMI) 29.0-29.9, adult: Secondary | ICD-10-CM | POA: Diagnosis not present

## 2017-02-24 DIAGNOSIS — F419 Anxiety disorder, unspecified: Secondary | ICD-10-CM | POA: Diagnosis not present

## 2017-02-24 DIAGNOSIS — R350 Frequency of micturition: Secondary | ICD-10-CM | POA: Diagnosis not present

## 2017-02-28 DIAGNOSIS — J438 Other emphysema: Secondary | ICD-10-CM | POA: Diagnosis not present

## 2017-03-10 DIAGNOSIS — E039 Hypothyroidism, unspecified: Secondary | ICD-10-CM | POA: Diagnosis not present

## 2017-03-10 DIAGNOSIS — R809 Proteinuria, unspecified: Secondary | ICD-10-CM | POA: Diagnosis not present

## 2017-03-10 DIAGNOSIS — E1129 Type 2 diabetes mellitus with other diabetic kidney complication: Secondary | ICD-10-CM | POA: Diagnosis not present

## 2017-03-10 DIAGNOSIS — M069 Rheumatoid arthritis, unspecified: Secondary | ICD-10-CM | POA: Diagnosis not present

## 2017-03-10 DIAGNOSIS — M329 Systemic lupus erythematosus, unspecified: Secondary | ICD-10-CM | POA: Diagnosis not present

## 2017-03-10 DIAGNOSIS — M3500 Sicca syndrome, unspecified: Secondary | ICD-10-CM | POA: Diagnosis not present

## 2017-03-10 DIAGNOSIS — R350 Frequency of micturition: Secondary | ICD-10-CM | POA: Diagnosis not present

## 2017-03-10 DIAGNOSIS — I10 Essential (primary) hypertension: Secondary | ICD-10-CM | POA: Diagnosis not present

## 2017-03-10 DIAGNOSIS — E1165 Type 2 diabetes mellitus with hyperglycemia: Secondary | ICD-10-CM | POA: Diagnosis not present

## 2017-03-10 DIAGNOSIS — J449 Chronic obstructive pulmonary disease, unspecified: Secondary | ICD-10-CM | POA: Diagnosis not present

## 2017-03-10 DIAGNOSIS — Z6828 Body mass index (BMI) 28.0-28.9, adult: Secondary | ICD-10-CM | POA: Diagnosis not present

## 2017-03-10 DIAGNOSIS — Z299 Encounter for prophylactic measures, unspecified: Secondary | ICD-10-CM | POA: Diagnosis not present

## 2017-04-18 DIAGNOSIS — M791 Myalgia: Secondary | ICD-10-CM | POA: Diagnosis not present

## 2017-04-18 DIAGNOSIS — M329 Systemic lupus erythematosus, unspecified: Secondary | ICD-10-CM | POA: Diagnosis not present

## 2017-04-18 DIAGNOSIS — Z299 Encounter for prophylactic measures, unspecified: Secondary | ICD-10-CM | POA: Diagnosis not present

## 2017-04-18 DIAGNOSIS — M3500 Sicca syndrome, unspecified: Secondary | ICD-10-CM | POA: Diagnosis not present

## 2017-04-18 DIAGNOSIS — F17200 Nicotine dependence, unspecified, uncomplicated: Secondary | ICD-10-CM | POA: Diagnosis not present

## 2017-04-18 DIAGNOSIS — Z6828 Body mass index (BMI) 28.0-28.9, adult: Secondary | ICD-10-CM | POA: Diagnosis not present

## 2017-04-18 DIAGNOSIS — M069 Rheumatoid arthritis, unspecified: Secondary | ICD-10-CM | POA: Diagnosis not present

## 2017-04-18 DIAGNOSIS — J449 Chronic obstructive pulmonary disease, unspecified: Secondary | ICD-10-CM | POA: Diagnosis not present

## 2017-04-18 DIAGNOSIS — I10 Essential (primary) hypertension: Secondary | ICD-10-CM | POA: Diagnosis not present

## 2017-04-18 DIAGNOSIS — E039 Hypothyroidism, unspecified: Secondary | ICD-10-CM | POA: Diagnosis not present

## 2017-04-18 DIAGNOSIS — E1165 Type 2 diabetes mellitus with hyperglycemia: Secondary | ICD-10-CM | POA: Diagnosis not present

## 2017-05-05 DIAGNOSIS — Z299 Encounter for prophylactic measures, unspecified: Secondary | ICD-10-CM | POA: Diagnosis not present

## 2017-05-05 DIAGNOSIS — F419 Anxiety disorder, unspecified: Secondary | ICD-10-CM | POA: Diagnosis not present

## 2017-05-05 DIAGNOSIS — M069 Rheumatoid arthritis, unspecified: Secondary | ICD-10-CM | POA: Diagnosis not present

## 2017-05-05 DIAGNOSIS — Z6828 Body mass index (BMI) 28.0-28.9, adult: Secondary | ICD-10-CM | POA: Diagnosis not present

## 2017-05-05 DIAGNOSIS — J449 Chronic obstructive pulmonary disease, unspecified: Secondary | ICD-10-CM | POA: Diagnosis not present

## 2017-05-05 DIAGNOSIS — F17200 Nicotine dependence, unspecified, uncomplicated: Secondary | ICD-10-CM | POA: Diagnosis not present

## 2017-05-05 DIAGNOSIS — E1165 Type 2 diabetes mellitus with hyperglycemia: Secondary | ICD-10-CM | POA: Diagnosis not present

## 2017-05-05 DIAGNOSIS — K589 Irritable bowel syndrome without diarrhea: Secondary | ICD-10-CM | POA: Diagnosis not present

## 2017-05-05 DIAGNOSIS — M329 Systemic lupus erythematosus, unspecified: Secondary | ICD-10-CM | POA: Diagnosis not present

## 2017-05-05 DIAGNOSIS — M3500 Sicca syndrome, unspecified: Secondary | ICD-10-CM | POA: Diagnosis not present

## 2017-05-05 DIAGNOSIS — I10 Essential (primary) hypertension: Secondary | ICD-10-CM | POA: Diagnosis not present

## 2017-05-05 DIAGNOSIS — E039 Hypothyroidism, unspecified: Secondary | ICD-10-CM | POA: Diagnosis not present

## 2017-06-24 DIAGNOSIS — E1165 Type 2 diabetes mellitus with hyperglycemia: Secondary | ICD-10-CM | POA: Diagnosis not present

## 2017-06-24 DIAGNOSIS — M940 Chondrocostal junction syndrome [Tietze]: Secondary | ICD-10-CM | POA: Diagnosis not present

## 2017-06-24 DIAGNOSIS — E039 Hypothyroidism, unspecified: Secondary | ICD-10-CM | POA: Diagnosis not present

## 2017-06-24 DIAGNOSIS — J449 Chronic obstructive pulmonary disease, unspecified: Secondary | ICD-10-CM | POA: Diagnosis not present

## 2017-06-24 DIAGNOSIS — M069 Rheumatoid arthritis, unspecified: Secondary | ICD-10-CM | POA: Diagnosis not present

## 2017-06-24 DIAGNOSIS — G4700 Insomnia, unspecified: Secondary | ICD-10-CM | POA: Diagnosis not present

## 2017-06-24 DIAGNOSIS — M329 Systemic lupus erythematosus, unspecified: Secondary | ICD-10-CM | POA: Diagnosis not present

## 2017-06-24 DIAGNOSIS — M3500 Sicca syndrome, unspecified: Secondary | ICD-10-CM | POA: Diagnosis not present

## 2017-06-24 DIAGNOSIS — R079 Chest pain, unspecified: Secondary | ICD-10-CM | POA: Diagnosis not present

## 2017-06-24 DIAGNOSIS — I10 Essential (primary) hypertension: Secondary | ICD-10-CM | POA: Diagnosis not present

## 2017-06-24 DIAGNOSIS — Z6828 Body mass index (BMI) 28.0-28.9, adult: Secondary | ICD-10-CM | POA: Diagnosis not present

## 2017-06-24 DIAGNOSIS — Z299 Encounter for prophylactic measures, unspecified: Secondary | ICD-10-CM | POA: Diagnosis not present

## 2017-07-08 DIAGNOSIS — Z6828 Body mass index (BMI) 28.0-28.9, adult: Secondary | ICD-10-CM | POA: Diagnosis not present

## 2017-07-08 DIAGNOSIS — I10 Essential (primary) hypertension: Secondary | ICD-10-CM | POA: Diagnosis not present

## 2017-07-08 DIAGNOSIS — E1165 Type 2 diabetes mellitus with hyperglycemia: Secondary | ICD-10-CM | POA: Diagnosis not present

## 2017-07-08 DIAGNOSIS — Z23 Encounter for immunization: Secondary | ICD-10-CM | POA: Diagnosis not present

## 2017-07-08 DIAGNOSIS — Z299 Encounter for prophylactic measures, unspecified: Secondary | ICD-10-CM | POA: Diagnosis not present

## 2017-07-08 DIAGNOSIS — K219 Gastro-esophageal reflux disease without esophagitis: Secondary | ICD-10-CM | POA: Diagnosis not present

## 2017-07-08 DIAGNOSIS — F5101 Primary insomnia: Secondary | ICD-10-CM | POA: Diagnosis not present

## 2017-08-12 DIAGNOSIS — F17210 Nicotine dependence, cigarettes, uncomplicated: Secondary | ICD-10-CM | POA: Diagnosis not present

## 2017-08-12 DIAGNOSIS — Z6828 Body mass index (BMI) 28.0-28.9, adult: Secondary | ICD-10-CM | POA: Diagnosis not present

## 2017-08-12 DIAGNOSIS — Z299 Encounter for prophylactic measures, unspecified: Secondary | ICD-10-CM | POA: Diagnosis not present

## 2017-08-12 DIAGNOSIS — J449 Chronic obstructive pulmonary disease, unspecified: Secondary | ICD-10-CM | POA: Diagnosis not present

## 2017-08-12 DIAGNOSIS — M069 Rheumatoid arthritis, unspecified: Secondary | ICD-10-CM | POA: Diagnosis not present

## 2017-08-12 DIAGNOSIS — R52 Pain, unspecified: Secondary | ICD-10-CM | POA: Diagnosis not present

## 2017-08-12 DIAGNOSIS — J069 Acute upper respiratory infection, unspecified: Secondary | ICD-10-CM | POA: Diagnosis not present

## 2017-09-05 DIAGNOSIS — M546 Pain in thoracic spine: Secondary | ICD-10-CM | POA: Diagnosis not present

## 2017-09-05 DIAGNOSIS — M47816 Spondylosis without myelopathy or radiculopathy, lumbar region: Secondary | ICD-10-CM | POA: Diagnosis not present

## 2017-09-05 DIAGNOSIS — M5136 Other intervertebral disc degeneration, lumbar region: Secondary | ICD-10-CM | POA: Diagnosis not present

## 2017-09-05 DIAGNOSIS — M5126 Other intervertebral disc displacement, lumbar region: Secondary | ICD-10-CM | POA: Diagnosis not present

## 2017-09-05 DIAGNOSIS — G8929 Other chronic pain: Secondary | ICD-10-CM | POA: Diagnosis not present

## 2017-09-05 DIAGNOSIS — M545 Low back pain: Secondary | ICD-10-CM | POA: Diagnosis not present

## 2017-09-07 DIAGNOSIS — F17210 Nicotine dependence, cigarettes, uncomplicated: Secondary | ICD-10-CM | POA: Diagnosis not present

## 2017-09-07 DIAGNOSIS — Z6828 Body mass index (BMI) 28.0-28.9, adult: Secondary | ICD-10-CM | POA: Diagnosis not present

## 2017-09-07 DIAGNOSIS — Z299 Encounter for prophylactic measures, unspecified: Secondary | ICD-10-CM | POA: Diagnosis not present

## 2017-09-07 DIAGNOSIS — J019 Acute sinusitis, unspecified: Secondary | ICD-10-CM | POA: Diagnosis not present

## 2017-09-07 DIAGNOSIS — J449 Chronic obstructive pulmonary disease, unspecified: Secondary | ICD-10-CM | POA: Diagnosis not present

## 2017-09-07 DIAGNOSIS — E1165 Type 2 diabetes mellitus with hyperglycemia: Secondary | ICD-10-CM | POA: Diagnosis not present

## 2017-09-07 DIAGNOSIS — M069 Rheumatoid arthritis, unspecified: Secondary | ICD-10-CM | POA: Diagnosis not present

## 2017-09-07 DIAGNOSIS — I10 Essential (primary) hypertension: Secondary | ICD-10-CM | POA: Diagnosis not present

## 2017-09-07 DIAGNOSIS — M329 Systemic lupus erythematosus, unspecified: Secondary | ICD-10-CM | POA: Diagnosis not present

## 2017-09-07 DIAGNOSIS — M3500 Sicca syndrome, unspecified: Secondary | ICD-10-CM | POA: Diagnosis not present

## 2017-09-15 DIAGNOSIS — M47816 Spondylosis without myelopathy or radiculopathy, lumbar region: Secondary | ICD-10-CM | POA: Diagnosis not present

## 2017-09-15 DIAGNOSIS — M5416 Radiculopathy, lumbar region: Secondary | ICD-10-CM | POA: Diagnosis not present

## 2017-11-04 DIAGNOSIS — Z1231 Encounter for screening mammogram for malignant neoplasm of breast: Secondary | ICD-10-CM | POA: Diagnosis not present

## 2017-11-10 DIAGNOSIS — I10 Essential (primary) hypertension: Secondary | ICD-10-CM | POA: Diagnosis not present

## 2017-11-10 DIAGNOSIS — R252 Cramp and spasm: Secondary | ICD-10-CM | POA: Diagnosis not present

## 2017-11-10 DIAGNOSIS — E1165 Type 2 diabetes mellitus with hyperglycemia: Secondary | ICD-10-CM | POA: Diagnosis not present

## 2017-11-10 DIAGNOSIS — Z299 Encounter for prophylactic measures, unspecified: Secondary | ICD-10-CM | POA: Diagnosis not present

## 2017-11-10 DIAGNOSIS — R350 Frequency of micturition: Secondary | ICD-10-CM | POA: Diagnosis not present

## 2017-11-10 DIAGNOSIS — J449 Chronic obstructive pulmonary disease, unspecified: Secondary | ICD-10-CM | POA: Diagnosis not present

## 2017-11-10 DIAGNOSIS — R05 Cough: Secondary | ICD-10-CM | POA: Diagnosis not present

## 2017-11-10 DIAGNOSIS — N390 Urinary tract infection, site not specified: Secondary | ICD-10-CM | POA: Diagnosis not present

## 2017-11-10 DIAGNOSIS — Z6829 Body mass index (BMI) 29.0-29.9, adult: Secondary | ICD-10-CM | POA: Diagnosis not present

## 2017-11-21 DIAGNOSIS — I10 Essential (primary) hypertension: Secondary | ICD-10-CM | POA: Diagnosis not present

## 2017-11-21 DIAGNOSIS — R0602 Shortness of breath: Secondary | ICD-10-CM | POA: Diagnosis not present

## 2017-11-21 DIAGNOSIS — Z888 Allergy status to other drugs, medicaments and biological substances status: Secondary | ICD-10-CM | POA: Diagnosis not present

## 2017-11-21 DIAGNOSIS — E119 Type 2 diabetes mellitus without complications: Secondary | ICD-10-CM | POA: Diagnosis not present

## 2017-11-21 DIAGNOSIS — Z299 Encounter for prophylactic measures, unspecified: Secondary | ICD-10-CM | POA: Diagnosis not present

## 2017-11-21 DIAGNOSIS — J111 Influenza due to unidentified influenza virus with other respiratory manifestations: Secondary | ICD-10-CM | POA: Diagnosis not present

## 2017-11-21 DIAGNOSIS — Z79899 Other long term (current) drug therapy: Secondary | ICD-10-CM | POA: Diagnosis not present

## 2017-11-21 DIAGNOSIS — G4700 Insomnia, unspecified: Secondary | ICD-10-CM | POA: Diagnosis not present

## 2017-11-21 DIAGNOSIS — M1990 Unspecified osteoarthritis, unspecified site: Secondary | ICD-10-CM | POA: Diagnosis not present

## 2017-11-21 DIAGNOSIS — M4800 Spinal stenosis, site unspecified: Secondary | ICD-10-CM | POA: Diagnosis not present

## 2017-11-21 DIAGNOSIS — R52 Pain, unspecified: Secondary | ICD-10-CM | POA: Diagnosis not present

## 2017-11-21 DIAGNOSIS — R509 Fever, unspecified: Secondary | ICD-10-CM | POA: Diagnosis not present

## 2017-11-21 DIAGNOSIS — M069 Rheumatoid arthritis, unspecified: Secondary | ICD-10-CM | POA: Diagnosis not present

## 2017-11-21 DIAGNOSIS — K219 Gastro-esophageal reflux disease without esophagitis: Secondary | ICD-10-CM | POA: Diagnosis not present

## 2017-11-21 DIAGNOSIS — N390 Urinary tract infection, site not specified: Secondary | ICD-10-CM | POA: Diagnosis not present

## 2017-11-21 DIAGNOSIS — R350 Frequency of micturition: Secondary | ICD-10-CM | POA: Diagnosis not present

## 2017-11-21 DIAGNOSIS — J449 Chronic obstructive pulmonary disease, unspecified: Secondary | ICD-10-CM | POA: Diagnosis not present

## 2017-11-21 DIAGNOSIS — K589 Irritable bowel syndrome without diarrhea: Secondary | ICD-10-CM | POA: Diagnosis not present

## 2017-11-21 DIAGNOSIS — M329 Systemic lupus erythematosus, unspecified: Secondary | ICD-10-CM | POA: Diagnosis not present

## 2017-11-21 DIAGNOSIS — E1165 Type 2 diabetes mellitus with hyperglycemia: Secondary | ICD-10-CM | POA: Diagnosis not present

## 2017-11-21 DIAGNOSIS — F17200 Nicotine dependence, unspecified, uncomplicated: Secondary | ICD-10-CM | POA: Diagnosis not present

## 2017-11-21 DIAGNOSIS — Z6827 Body mass index (BMI) 27.0-27.9, adult: Secondary | ICD-10-CM | POA: Diagnosis not present

## 2017-11-21 DIAGNOSIS — G8929 Other chronic pain: Secondary | ICD-10-CM | POA: Diagnosis not present

## 2017-11-22 DIAGNOSIS — N390 Urinary tract infection, site not specified: Secondary | ICD-10-CM | POA: Diagnosis not present

## 2017-11-22 DIAGNOSIS — E119 Type 2 diabetes mellitus without complications: Secondary | ICD-10-CM | POA: Diagnosis not present

## 2017-11-22 DIAGNOSIS — J111 Influenza due to unidentified influenza virus with other respiratory manifestations: Secondary | ICD-10-CM | POA: Diagnosis not present

## 2017-11-22 DIAGNOSIS — M1990 Unspecified osteoarthritis, unspecified site: Secondary | ICD-10-CM | POA: Diagnosis not present

## 2017-11-22 DIAGNOSIS — J449 Chronic obstructive pulmonary disease, unspecified: Secondary | ICD-10-CM | POA: Diagnosis not present

## 2017-11-22 DIAGNOSIS — I10 Essential (primary) hypertension: Secondary | ICD-10-CM | POA: Diagnosis not present

## 2017-11-29 DIAGNOSIS — F17210 Nicotine dependence, cigarettes, uncomplicated: Secondary | ICD-10-CM | POA: Diagnosis not present

## 2017-11-29 DIAGNOSIS — J029 Acute pharyngitis, unspecified: Secondary | ICD-10-CM | POA: Diagnosis not present

## 2017-11-29 DIAGNOSIS — Z299 Encounter for prophylactic measures, unspecified: Secondary | ICD-10-CM | POA: Diagnosis not present

## 2017-11-29 DIAGNOSIS — I10 Essential (primary) hypertension: Secondary | ICD-10-CM | POA: Diagnosis not present

## 2017-11-29 DIAGNOSIS — J449 Chronic obstructive pulmonary disease, unspecified: Secondary | ICD-10-CM | POA: Diagnosis not present

## 2017-11-29 DIAGNOSIS — R05 Cough: Secondary | ICD-10-CM | POA: Diagnosis not present

## 2017-11-29 DIAGNOSIS — M069 Rheumatoid arthritis, unspecified: Secondary | ICD-10-CM | POA: Diagnosis not present

## 2017-11-29 DIAGNOSIS — Z6827 Body mass index (BMI) 27.0-27.9, adult: Secondary | ICD-10-CM | POA: Diagnosis not present

## 2017-11-29 DIAGNOSIS — E039 Hypothyroidism, unspecified: Secondary | ICD-10-CM | POA: Diagnosis not present

## 2017-12-14 DIAGNOSIS — Z299 Encounter for prophylactic measures, unspecified: Secondary | ICD-10-CM | POA: Diagnosis not present

## 2017-12-14 DIAGNOSIS — E049 Nontoxic goiter, unspecified: Secondary | ICD-10-CM | POA: Diagnosis not present

## 2017-12-14 DIAGNOSIS — R5383 Other fatigue: Secondary | ICD-10-CM | POA: Diagnosis not present

## 2017-12-14 DIAGNOSIS — F17210 Nicotine dependence, cigarettes, uncomplicated: Secondary | ICD-10-CM | POA: Diagnosis not present

## 2017-12-14 DIAGNOSIS — E1165 Type 2 diabetes mellitus with hyperglycemia: Secondary | ICD-10-CM | POA: Diagnosis not present

## 2017-12-14 DIAGNOSIS — R350 Frequency of micturition: Secondary | ICD-10-CM | POA: Diagnosis not present

## 2017-12-14 DIAGNOSIS — N390 Urinary tract infection, site not specified: Secondary | ICD-10-CM | POA: Diagnosis not present

## 2017-12-14 DIAGNOSIS — Z6828 Body mass index (BMI) 28.0-28.9, adult: Secondary | ICD-10-CM | POA: Diagnosis not present

## 2017-12-28 DIAGNOSIS — Z299 Encounter for prophylactic measures, unspecified: Secondary | ICD-10-CM | POA: Diagnosis not present

## 2017-12-28 DIAGNOSIS — R350 Frequency of micturition: Secondary | ICD-10-CM | POA: Diagnosis not present

## 2017-12-28 DIAGNOSIS — Z6828 Body mass index (BMI) 28.0-28.9, adult: Secondary | ICD-10-CM | POA: Diagnosis not present

## 2017-12-28 DIAGNOSIS — E1165 Type 2 diabetes mellitus with hyperglycemia: Secondary | ICD-10-CM | POA: Diagnosis not present

## 2017-12-28 DIAGNOSIS — I10 Essential (primary) hypertension: Secondary | ICD-10-CM | POA: Diagnosis not present

## 2017-12-28 DIAGNOSIS — J449 Chronic obstructive pulmonary disease, unspecified: Secondary | ICD-10-CM | POA: Diagnosis not present

## 2017-12-28 DIAGNOSIS — J680 Bronchitis and pneumonitis due to chemicals, gases, fumes and vapors: Secondary | ICD-10-CM | POA: Diagnosis not present

## 2017-12-29 DIAGNOSIS — E119 Type 2 diabetes mellitus without complications: Secondary | ICD-10-CM | POA: Diagnosis present

## 2017-12-29 DIAGNOSIS — Z886 Allergy status to analgesic agent status: Secondary | ICD-10-CM | POA: Diagnosis not present

## 2017-12-29 DIAGNOSIS — E871 Hypo-osmolality and hyponatremia: Secondary | ICD-10-CM | POA: Diagnosis present

## 2017-12-29 DIAGNOSIS — T41295A Adverse effect of other general anesthetics, initial encounter: Secondary | ICD-10-CM | POA: Diagnosis present

## 2017-12-29 DIAGNOSIS — J9601 Acute respiratory failure with hypoxia: Secondary | ICD-10-CM | POA: Diagnosis present

## 2017-12-29 DIAGNOSIS — F17210 Nicotine dependence, cigarettes, uncomplicated: Secondary | ICD-10-CM | POA: Diagnosis present

## 2017-12-29 DIAGNOSIS — J441 Chronic obstructive pulmonary disease with (acute) exacerbation: Secondary | ICD-10-CM | POA: Diagnosis not present

## 2017-12-29 DIAGNOSIS — M329 Systemic lupus erythematosus, unspecified: Secondary | ICD-10-CM | POA: Diagnosis present

## 2017-12-29 DIAGNOSIS — Z720 Tobacco use: Secondary | ICD-10-CM | POA: Diagnosis not present

## 2017-12-29 DIAGNOSIS — I10 Essential (primary) hypertension: Secondary | ICD-10-CM | POA: Diagnosis present

## 2017-12-29 DIAGNOSIS — J440 Chronic obstructive pulmonary disease with acute lower respiratory infection: Secondary | ICD-10-CM | POA: Diagnosis present

## 2017-12-29 DIAGNOSIS — M797 Fibromyalgia: Secondary | ICD-10-CM | POA: Diagnosis present

## 2017-12-29 DIAGNOSIS — Z79899 Other long term (current) drug therapy: Secondary | ICD-10-CM | POA: Diagnosis not present

## 2017-12-29 DIAGNOSIS — M069 Rheumatoid arthritis, unspecified: Secondary | ICD-10-CM | POA: Diagnosis present

## 2017-12-29 DIAGNOSIS — Z4682 Encounter for fitting and adjustment of non-vascular catheter: Secondary | ICD-10-CM | POA: Diagnosis not present

## 2017-12-29 DIAGNOSIS — J209 Acute bronchitis, unspecified: Secondary | ICD-10-CM | POA: Diagnosis present

## 2017-12-29 DIAGNOSIS — K219 Gastro-esophageal reflux disease without esophagitis: Secondary | ICD-10-CM | POA: Diagnosis present

## 2017-12-29 DIAGNOSIS — E0590 Thyrotoxicosis, unspecified without thyrotoxic crisis or storm: Secondary | ICD-10-CM | POA: Diagnosis present

## 2017-12-29 DIAGNOSIS — Z885 Allergy status to narcotic agent status: Secondary | ICD-10-CM | POA: Diagnosis not present

## 2017-12-29 DIAGNOSIS — J9602 Acute respiratory failure with hypercapnia: Secondary | ICD-10-CM | POA: Diagnosis not present

## 2017-12-29 DIAGNOSIS — I952 Hypotension due to drugs: Secondary | ICD-10-CM | POA: Diagnosis not present

## 2017-12-29 DIAGNOSIS — Z7982 Long term (current) use of aspirin: Secondary | ICD-10-CM | POA: Diagnosis not present

## 2017-12-29 DIAGNOSIS — R0603 Acute respiratory distress: Secondary | ICD-10-CM | POA: Diagnosis not present

## 2018-01-04 ENCOUNTER — Ambulatory Visit: Payer: Self-pay | Admitting: Allergy and Immunology

## 2018-01-09 DIAGNOSIS — Z87891 Personal history of nicotine dependence: Secondary | ICD-10-CM | POA: Diagnosis not present

## 2018-01-09 DIAGNOSIS — I10 Essential (primary) hypertension: Secondary | ICD-10-CM | POA: Diagnosis not present

## 2018-01-09 DIAGNOSIS — M069 Rheumatoid arthritis, unspecified: Secondary | ICD-10-CM | POA: Diagnosis not present

## 2018-01-09 DIAGNOSIS — E119 Type 2 diabetes mellitus without complications: Secondary | ICD-10-CM | POA: Diagnosis not present

## 2018-01-09 DIAGNOSIS — J441 Chronic obstructive pulmonary disease with (acute) exacerbation: Secondary | ICD-10-CM | POA: Diagnosis not present

## 2018-01-09 DIAGNOSIS — M4800 Spinal stenosis, site unspecified: Secondary | ICD-10-CM | POA: Diagnosis not present

## 2018-01-11 DIAGNOSIS — J441 Chronic obstructive pulmonary disease with (acute) exacerbation: Secondary | ICD-10-CM | POA: Diagnosis not present

## 2018-01-11 DIAGNOSIS — R079 Chest pain, unspecified: Secondary | ICD-10-CM | POA: Diagnosis not present

## 2018-01-11 DIAGNOSIS — E119 Type 2 diabetes mellitus without complications: Secondary | ICD-10-CM | POA: Diagnosis not present

## 2018-01-11 DIAGNOSIS — E1165 Type 2 diabetes mellitus with hyperglycemia: Secondary | ICD-10-CM | POA: Diagnosis not present

## 2018-01-11 DIAGNOSIS — R0602 Shortness of breath: Secondary | ICD-10-CM | POA: Diagnosis not present

## 2018-01-11 DIAGNOSIS — E0590 Thyrotoxicosis, unspecified without thyrotoxic crisis or storm: Secondary | ICD-10-CM | POA: Diagnosis not present

## 2018-01-11 DIAGNOSIS — M3500 Sicca syndrome, unspecified: Secondary | ICD-10-CM | POA: Diagnosis not present

## 2018-01-11 DIAGNOSIS — Z299 Encounter for prophylactic measures, unspecified: Secondary | ICD-10-CM | POA: Diagnosis not present

## 2018-01-11 DIAGNOSIS — M069 Rheumatoid arthritis, unspecified: Secondary | ICD-10-CM | POA: Diagnosis not present

## 2018-01-11 DIAGNOSIS — I10 Essential (primary) hypertension: Secondary | ICD-10-CM | POA: Diagnosis not present

## 2018-01-11 DIAGNOSIS — J449 Chronic obstructive pulmonary disease, unspecified: Secondary | ICD-10-CM | POA: Diagnosis not present

## 2018-01-11 DIAGNOSIS — Z6828 Body mass index (BMI) 28.0-28.9, adult: Secondary | ICD-10-CM | POA: Diagnosis not present

## 2018-01-11 DIAGNOSIS — K5900 Constipation, unspecified: Secondary | ICD-10-CM | POA: Diagnosis not present

## 2018-01-11 DIAGNOSIS — M329 Systemic lupus erythematosus, unspecified: Secondary | ICD-10-CM | POA: Diagnosis not present

## 2018-01-11 DIAGNOSIS — Z87891 Personal history of nicotine dependence: Secondary | ICD-10-CM | POA: Diagnosis not present

## 2018-01-11 DIAGNOSIS — R0781 Pleurodynia: Secondary | ICD-10-CM | POA: Diagnosis not present

## 2018-01-12 DIAGNOSIS — R140 Abdominal distension (gaseous): Secondary | ICD-10-CM | POA: Diagnosis not present

## 2018-01-12 DIAGNOSIS — G4700 Insomnia, unspecified: Secondary | ICD-10-CM | POA: Diagnosis present

## 2018-01-12 DIAGNOSIS — K219 Gastro-esophageal reflux disease without esophagitis: Secondary | ICD-10-CM | POA: Diagnosis present

## 2018-01-12 DIAGNOSIS — M797 Fibromyalgia: Secondary | ICD-10-CM | POA: Diagnosis present

## 2018-01-12 DIAGNOSIS — K5900 Constipation, unspecified: Secondary | ICD-10-CM | POA: Diagnosis not present

## 2018-01-12 DIAGNOSIS — E119 Type 2 diabetes mellitus without complications: Secondary | ICD-10-CM | POA: Diagnosis present

## 2018-01-12 DIAGNOSIS — E669 Obesity, unspecified: Secondary | ICD-10-CM | POA: Diagnosis present

## 2018-01-12 DIAGNOSIS — R0602 Shortness of breath: Secondary | ICD-10-CM | POA: Diagnosis not present

## 2018-01-12 DIAGNOSIS — M4800 Spinal stenosis, site unspecified: Secondary | ICD-10-CM | POA: Diagnosis not present

## 2018-01-12 DIAGNOSIS — I10 Essential (primary) hypertension: Secondary | ICD-10-CM | POA: Diagnosis not present

## 2018-01-12 DIAGNOSIS — M069 Rheumatoid arthritis, unspecified: Secondary | ICD-10-CM | POA: Diagnosis present

## 2018-01-12 DIAGNOSIS — K567 Ileus, unspecified: Secondary | ICD-10-CM | POA: Diagnosis not present

## 2018-01-12 DIAGNOSIS — Z87891 Personal history of nicotine dependence: Secondary | ICD-10-CM | POA: Diagnosis not present

## 2018-01-12 DIAGNOSIS — E0590 Thyrotoxicosis, unspecified without thyrotoxic crisis or storm: Secondary | ICD-10-CM | POA: Diagnosis present

## 2018-01-12 DIAGNOSIS — J441 Chronic obstructive pulmonary disease with (acute) exacerbation: Secondary | ICD-10-CM | POA: Diagnosis not present

## 2018-01-19 DIAGNOSIS — E119 Type 2 diabetes mellitus without complications: Secondary | ICD-10-CM | POA: Diagnosis not present

## 2018-01-19 DIAGNOSIS — M4800 Spinal stenosis, site unspecified: Secondary | ICD-10-CM | POA: Diagnosis not present

## 2018-01-19 DIAGNOSIS — M069 Rheumatoid arthritis, unspecified: Secondary | ICD-10-CM | POA: Diagnosis not present

## 2018-01-19 DIAGNOSIS — Z87891 Personal history of nicotine dependence: Secondary | ICD-10-CM | POA: Diagnosis not present

## 2018-01-19 DIAGNOSIS — J441 Chronic obstructive pulmonary disease with (acute) exacerbation: Secondary | ICD-10-CM | POA: Diagnosis not present

## 2018-01-19 DIAGNOSIS — I10 Essential (primary) hypertension: Secondary | ICD-10-CM | POA: Diagnosis not present

## 2018-01-23 DIAGNOSIS — Z87891 Personal history of nicotine dependence: Secondary | ICD-10-CM | POA: Diagnosis not present

## 2018-01-23 DIAGNOSIS — Z713 Dietary counseling and surveillance: Secondary | ICD-10-CM | POA: Diagnosis not present

## 2018-01-23 DIAGNOSIS — R05 Cough: Secondary | ICD-10-CM | POA: Diagnosis not present

## 2018-01-23 DIAGNOSIS — J449 Chronic obstructive pulmonary disease, unspecified: Secondary | ICD-10-CM | POA: Diagnosis not present

## 2018-01-23 DIAGNOSIS — Z299 Encounter for prophylactic measures, unspecified: Secondary | ICD-10-CM | POA: Diagnosis not present

## 2018-01-23 DIAGNOSIS — I10 Essential (primary) hypertension: Secondary | ICD-10-CM | POA: Diagnosis not present

## 2018-01-25 DIAGNOSIS — M4800 Spinal stenosis, site unspecified: Secondary | ICD-10-CM | POA: Diagnosis not present

## 2018-01-25 DIAGNOSIS — J441 Chronic obstructive pulmonary disease with (acute) exacerbation: Secondary | ICD-10-CM | POA: Diagnosis not present

## 2018-01-25 DIAGNOSIS — M069 Rheumatoid arthritis, unspecified: Secondary | ICD-10-CM | POA: Diagnosis not present

## 2018-01-25 DIAGNOSIS — I10 Essential (primary) hypertension: Secondary | ICD-10-CM | POA: Diagnosis not present

## 2018-01-25 DIAGNOSIS — Z87891 Personal history of nicotine dependence: Secondary | ICD-10-CM | POA: Diagnosis not present

## 2018-01-25 DIAGNOSIS — E119 Type 2 diabetes mellitus without complications: Secondary | ICD-10-CM | POA: Diagnosis not present

## 2018-01-26 DIAGNOSIS — E119 Type 2 diabetes mellitus without complications: Secondary | ICD-10-CM | POA: Diagnosis not present

## 2018-01-26 DIAGNOSIS — M4800 Spinal stenosis, site unspecified: Secondary | ICD-10-CM | POA: Diagnosis not present

## 2018-01-26 DIAGNOSIS — Z87891 Personal history of nicotine dependence: Secondary | ICD-10-CM | POA: Diagnosis not present

## 2018-01-26 DIAGNOSIS — J441 Chronic obstructive pulmonary disease with (acute) exacerbation: Secondary | ICD-10-CM | POA: Diagnosis not present

## 2018-01-26 DIAGNOSIS — M069 Rheumatoid arthritis, unspecified: Secondary | ICD-10-CM | POA: Diagnosis not present

## 2018-01-26 DIAGNOSIS — I10 Essential (primary) hypertension: Secondary | ICD-10-CM | POA: Diagnosis not present

## 2018-01-28 DIAGNOSIS — I10 Essential (primary) hypertension: Secondary | ICD-10-CM | POA: Diagnosis not present

## 2018-01-28 DIAGNOSIS — M4800 Spinal stenosis, site unspecified: Secondary | ICD-10-CM | POA: Diagnosis not present

## 2018-01-28 DIAGNOSIS — M069 Rheumatoid arthritis, unspecified: Secondary | ICD-10-CM | POA: Diagnosis not present

## 2018-01-28 DIAGNOSIS — E119 Type 2 diabetes mellitus without complications: Secondary | ICD-10-CM | POA: Diagnosis not present

## 2018-01-28 DIAGNOSIS — J441 Chronic obstructive pulmonary disease with (acute) exacerbation: Secondary | ICD-10-CM | POA: Diagnosis not present

## 2018-01-28 DIAGNOSIS — Z87891 Personal history of nicotine dependence: Secondary | ICD-10-CM | POA: Diagnosis not present

## 2018-01-30 DIAGNOSIS — M4800 Spinal stenosis, site unspecified: Secondary | ICD-10-CM | POA: Diagnosis not present

## 2018-01-30 DIAGNOSIS — M069 Rheumatoid arthritis, unspecified: Secondary | ICD-10-CM | POA: Diagnosis not present

## 2018-01-30 DIAGNOSIS — E119 Type 2 diabetes mellitus without complications: Secondary | ICD-10-CM | POA: Diagnosis not present

## 2018-01-30 DIAGNOSIS — I10 Essential (primary) hypertension: Secondary | ICD-10-CM | POA: Diagnosis not present

## 2018-01-30 DIAGNOSIS — Z87891 Personal history of nicotine dependence: Secondary | ICD-10-CM | POA: Diagnosis not present

## 2018-01-30 DIAGNOSIS — J441 Chronic obstructive pulmonary disease with (acute) exacerbation: Secondary | ICD-10-CM | POA: Diagnosis not present

## 2018-02-01 DIAGNOSIS — J441 Chronic obstructive pulmonary disease with (acute) exacerbation: Secondary | ICD-10-CM | POA: Diagnosis not present

## 2018-02-01 DIAGNOSIS — Z87891 Personal history of nicotine dependence: Secondary | ICD-10-CM | POA: Diagnosis not present

## 2018-02-01 DIAGNOSIS — M069 Rheumatoid arthritis, unspecified: Secondary | ICD-10-CM | POA: Diagnosis not present

## 2018-02-01 DIAGNOSIS — E119 Type 2 diabetes mellitus without complications: Secondary | ICD-10-CM | POA: Diagnosis not present

## 2018-02-01 DIAGNOSIS — M4800 Spinal stenosis, site unspecified: Secondary | ICD-10-CM | POA: Diagnosis not present

## 2018-02-01 DIAGNOSIS — I10 Essential (primary) hypertension: Secondary | ICD-10-CM | POA: Diagnosis not present

## 2018-02-02 DIAGNOSIS — M069 Rheumatoid arthritis, unspecified: Secondary | ICD-10-CM | POA: Diagnosis not present

## 2018-02-02 DIAGNOSIS — M4800 Spinal stenosis, site unspecified: Secondary | ICD-10-CM | POA: Diagnosis not present

## 2018-02-02 DIAGNOSIS — E119 Type 2 diabetes mellitus without complications: Secondary | ICD-10-CM | POA: Diagnosis not present

## 2018-02-02 DIAGNOSIS — J441 Chronic obstructive pulmonary disease with (acute) exacerbation: Secondary | ICD-10-CM | POA: Diagnosis not present

## 2018-02-02 DIAGNOSIS — Z87891 Personal history of nicotine dependence: Secondary | ICD-10-CM | POA: Diagnosis not present

## 2018-02-02 DIAGNOSIS — I10 Essential (primary) hypertension: Secondary | ICD-10-CM | POA: Diagnosis not present

## 2018-02-03 DIAGNOSIS — E119 Type 2 diabetes mellitus without complications: Secondary | ICD-10-CM | POA: Diagnosis not present

## 2018-02-03 DIAGNOSIS — I10 Essential (primary) hypertension: Secondary | ICD-10-CM | POA: Diagnosis not present

## 2018-02-03 DIAGNOSIS — J441 Chronic obstructive pulmonary disease with (acute) exacerbation: Secondary | ICD-10-CM | POA: Diagnosis not present

## 2018-02-03 DIAGNOSIS — Z87891 Personal history of nicotine dependence: Secondary | ICD-10-CM | POA: Diagnosis not present

## 2018-02-03 DIAGNOSIS — M069 Rheumatoid arthritis, unspecified: Secondary | ICD-10-CM | POA: Diagnosis not present

## 2018-02-03 DIAGNOSIS — M4800 Spinal stenosis, site unspecified: Secondary | ICD-10-CM | POA: Diagnosis not present

## 2018-02-06 DIAGNOSIS — Z87891 Personal history of nicotine dependence: Secondary | ICD-10-CM | POA: Diagnosis not present

## 2018-02-06 DIAGNOSIS — M069 Rheumatoid arthritis, unspecified: Secondary | ICD-10-CM | POA: Diagnosis not present

## 2018-02-06 DIAGNOSIS — I10 Essential (primary) hypertension: Secondary | ICD-10-CM | POA: Diagnosis not present

## 2018-02-06 DIAGNOSIS — J441 Chronic obstructive pulmonary disease with (acute) exacerbation: Secondary | ICD-10-CM | POA: Diagnosis not present

## 2018-02-06 DIAGNOSIS — M4800 Spinal stenosis, site unspecified: Secondary | ICD-10-CM | POA: Diagnosis not present

## 2018-02-06 DIAGNOSIS — E119 Type 2 diabetes mellitus without complications: Secondary | ICD-10-CM | POA: Diagnosis not present

## 2018-02-07 DIAGNOSIS — J441 Chronic obstructive pulmonary disease with (acute) exacerbation: Secondary | ICD-10-CM | POA: Diagnosis not present

## 2018-02-07 DIAGNOSIS — M069 Rheumatoid arthritis, unspecified: Secondary | ICD-10-CM | POA: Diagnosis not present

## 2018-02-07 DIAGNOSIS — E119 Type 2 diabetes mellitus without complications: Secondary | ICD-10-CM | POA: Diagnosis not present

## 2018-02-07 DIAGNOSIS — M4800 Spinal stenosis, site unspecified: Secondary | ICD-10-CM | POA: Diagnosis not present

## 2018-02-07 DIAGNOSIS — I10 Essential (primary) hypertension: Secondary | ICD-10-CM | POA: Diagnosis not present

## 2018-02-07 DIAGNOSIS — Z87891 Personal history of nicotine dependence: Secondary | ICD-10-CM | POA: Diagnosis not present

## 2018-02-09 DIAGNOSIS — Z87891 Personal history of nicotine dependence: Secondary | ICD-10-CM | POA: Diagnosis not present

## 2018-02-09 DIAGNOSIS — M069 Rheumatoid arthritis, unspecified: Secondary | ICD-10-CM | POA: Diagnosis not present

## 2018-02-09 DIAGNOSIS — E119 Type 2 diabetes mellitus without complications: Secondary | ICD-10-CM | POA: Diagnosis not present

## 2018-02-09 DIAGNOSIS — I10 Essential (primary) hypertension: Secondary | ICD-10-CM | POA: Diagnosis not present

## 2018-02-09 DIAGNOSIS — M4800 Spinal stenosis, site unspecified: Secondary | ICD-10-CM | POA: Diagnosis not present

## 2018-02-09 DIAGNOSIS — J441 Chronic obstructive pulmonary disease with (acute) exacerbation: Secondary | ICD-10-CM | POA: Diagnosis not present

## 2018-02-13 DIAGNOSIS — M4800 Spinal stenosis, site unspecified: Secondary | ICD-10-CM | POA: Diagnosis not present

## 2018-02-13 DIAGNOSIS — E119 Type 2 diabetes mellitus without complications: Secondary | ICD-10-CM | POA: Diagnosis not present

## 2018-02-13 DIAGNOSIS — Z87891 Personal history of nicotine dependence: Secondary | ICD-10-CM | POA: Diagnosis not present

## 2018-02-13 DIAGNOSIS — I10 Essential (primary) hypertension: Secondary | ICD-10-CM | POA: Diagnosis not present

## 2018-02-13 DIAGNOSIS — J441 Chronic obstructive pulmonary disease with (acute) exacerbation: Secondary | ICD-10-CM | POA: Diagnosis not present

## 2018-02-13 DIAGNOSIS — M069 Rheumatoid arthritis, unspecified: Secondary | ICD-10-CM | POA: Diagnosis not present

## 2018-02-14 DIAGNOSIS — M069 Rheumatoid arthritis, unspecified: Secondary | ICD-10-CM | POA: Diagnosis not present

## 2018-02-14 DIAGNOSIS — J441 Chronic obstructive pulmonary disease with (acute) exacerbation: Secondary | ICD-10-CM | POA: Diagnosis not present

## 2018-02-14 DIAGNOSIS — M4800 Spinal stenosis, site unspecified: Secondary | ICD-10-CM | POA: Diagnosis not present

## 2018-02-14 DIAGNOSIS — Z87891 Personal history of nicotine dependence: Secondary | ICD-10-CM | POA: Diagnosis not present

## 2018-02-14 DIAGNOSIS — I10 Essential (primary) hypertension: Secondary | ICD-10-CM | POA: Diagnosis not present

## 2018-02-14 DIAGNOSIS — E119 Type 2 diabetes mellitus without complications: Secondary | ICD-10-CM | POA: Diagnosis not present

## 2018-02-15 DIAGNOSIS — E119 Type 2 diabetes mellitus without complications: Secondary | ICD-10-CM | POA: Diagnosis not present

## 2018-02-15 DIAGNOSIS — I10 Essential (primary) hypertension: Secondary | ICD-10-CM | POA: Diagnosis not present

## 2018-02-15 DIAGNOSIS — M4800 Spinal stenosis, site unspecified: Secondary | ICD-10-CM | POA: Diagnosis not present

## 2018-02-15 DIAGNOSIS — J441 Chronic obstructive pulmonary disease with (acute) exacerbation: Secondary | ICD-10-CM | POA: Diagnosis not present

## 2018-02-15 DIAGNOSIS — Z87891 Personal history of nicotine dependence: Secondary | ICD-10-CM | POA: Diagnosis not present

## 2018-02-15 DIAGNOSIS — M069 Rheumatoid arthritis, unspecified: Secondary | ICD-10-CM | POA: Diagnosis not present

## 2018-02-16 DIAGNOSIS — M4800 Spinal stenosis, site unspecified: Secondary | ICD-10-CM | POA: Diagnosis not present

## 2018-02-16 DIAGNOSIS — J441 Chronic obstructive pulmonary disease with (acute) exacerbation: Secondary | ICD-10-CM | POA: Diagnosis not present

## 2018-02-16 DIAGNOSIS — Z87891 Personal history of nicotine dependence: Secondary | ICD-10-CM | POA: Diagnosis not present

## 2018-02-16 DIAGNOSIS — I10 Essential (primary) hypertension: Secondary | ICD-10-CM | POA: Diagnosis not present

## 2018-02-16 DIAGNOSIS — M069 Rheumatoid arthritis, unspecified: Secondary | ICD-10-CM | POA: Diagnosis not present

## 2018-02-16 DIAGNOSIS — E119 Type 2 diabetes mellitus without complications: Secondary | ICD-10-CM | POA: Diagnosis not present

## 2018-02-20 DIAGNOSIS — I10 Essential (primary) hypertension: Secondary | ICD-10-CM | POA: Diagnosis not present

## 2018-02-20 DIAGNOSIS — M069 Rheumatoid arthritis, unspecified: Secondary | ICD-10-CM | POA: Diagnosis not present

## 2018-02-20 DIAGNOSIS — J441 Chronic obstructive pulmonary disease with (acute) exacerbation: Secondary | ICD-10-CM | POA: Diagnosis not present

## 2018-02-20 DIAGNOSIS — Z87891 Personal history of nicotine dependence: Secondary | ICD-10-CM | POA: Diagnosis not present

## 2018-02-20 DIAGNOSIS — M4800 Spinal stenosis, site unspecified: Secondary | ICD-10-CM | POA: Diagnosis not present

## 2018-02-20 DIAGNOSIS — E119 Type 2 diabetes mellitus without complications: Secondary | ICD-10-CM | POA: Diagnosis not present

## 2018-02-21 DIAGNOSIS — M069 Rheumatoid arthritis, unspecified: Secondary | ICD-10-CM | POA: Diagnosis not present

## 2018-02-21 DIAGNOSIS — Z87891 Personal history of nicotine dependence: Secondary | ICD-10-CM | POA: Diagnosis not present

## 2018-02-21 DIAGNOSIS — I10 Essential (primary) hypertension: Secondary | ICD-10-CM | POA: Diagnosis not present

## 2018-02-21 DIAGNOSIS — M4800 Spinal stenosis, site unspecified: Secondary | ICD-10-CM | POA: Diagnosis not present

## 2018-02-21 DIAGNOSIS — E119 Type 2 diabetes mellitus without complications: Secondary | ICD-10-CM | POA: Diagnosis not present

## 2018-02-21 DIAGNOSIS — J441 Chronic obstructive pulmonary disease with (acute) exacerbation: Secondary | ICD-10-CM | POA: Diagnosis not present

## 2018-02-22 DIAGNOSIS — K5909 Other constipation: Secondary | ICD-10-CM | POA: Diagnosis not present

## 2018-02-27 DIAGNOSIS — E1165 Type 2 diabetes mellitus with hyperglycemia: Secondary | ICD-10-CM | POA: Diagnosis not present

## 2018-02-27 DIAGNOSIS — R5383 Other fatigue: Secondary | ICD-10-CM | POA: Diagnosis not present

## 2018-02-27 DIAGNOSIS — I10 Essential (primary) hypertension: Secondary | ICD-10-CM | POA: Diagnosis not present

## 2018-02-27 DIAGNOSIS — Z1331 Encounter for screening for depression: Secondary | ICD-10-CM | POA: Diagnosis not present

## 2018-02-27 DIAGNOSIS — Z1339 Encounter for screening examination for other mental health and behavioral disorders: Secondary | ICD-10-CM | POA: Diagnosis not present

## 2018-02-27 DIAGNOSIS — Z7189 Other specified counseling: Secondary | ICD-10-CM | POA: Diagnosis not present

## 2018-02-27 DIAGNOSIS — R350 Frequency of micturition: Secondary | ICD-10-CM | POA: Diagnosis not present

## 2018-02-27 DIAGNOSIS — Z1211 Encounter for screening for malignant neoplasm of colon: Secondary | ICD-10-CM | POA: Diagnosis not present

## 2018-02-27 DIAGNOSIS — Z0000 Encounter for general adult medical examination without abnormal findings: Secondary | ICD-10-CM | POA: Diagnosis not present

## 2018-02-27 DIAGNOSIS — E039 Hypothyroidism, unspecified: Secondary | ICD-10-CM | POA: Diagnosis not present

## 2018-02-27 DIAGNOSIS — Z6828 Body mass index (BMI) 28.0-28.9, adult: Secondary | ICD-10-CM | POA: Diagnosis not present

## 2018-02-27 DIAGNOSIS — Z299 Encounter for prophylactic measures, unspecified: Secondary | ICD-10-CM | POA: Diagnosis not present

## 2018-03-01 DIAGNOSIS — E039 Hypothyroidism, unspecified: Secondary | ICD-10-CM | POA: Diagnosis not present

## 2018-03-01 DIAGNOSIS — Z79899 Other long term (current) drug therapy: Secondary | ICD-10-CM | POA: Diagnosis not present

## 2018-03-01 DIAGNOSIS — R5383 Other fatigue: Secondary | ICD-10-CM | POA: Diagnosis not present

## 2018-03-01 DIAGNOSIS — E559 Vitamin D deficiency, unspecified: Secondary | ICD-10-CM | POA: Diagnosis not present

## 2018-03-01 DIAGNOSIS — E7800 Pure hypercholesterolemia, unspecified: Secondary | ICD-10-CM | POA: Diagnosis not present

## 2018-03-02 DIAGNOSIS — J441 Chronic obstructive pulmonary disease with (acute) exacerbation: Secondary | ICD-10-CM | POA: Diagnosis not present

## 2018-03-02 DIAGNOSIS — I10 Essential (primary) hypertension: Secondary | ICD-10-CM | POA: Diagnosis not present

## 2018-03-02 DIAGNOSIS — M069 Rheumatoid arthritis, unspecified: Secondary | ICD-10-CM | POA: Diagnosis not present

## 2018-03-02 DIAGNOSIS — Z87891 Personal history of nicotine dependence: Secondary | ICD-10-CM | POA: Diagnosis not present

## 2018-03-02 DIAGNOSIS — E119 Type 2 diabetes mellitus without complications: Secondary | ICD-10-CM | POA: Diagnosis not present

## 2018-03-02 DIAGNOSIS — M4800 Spinal stenosis, site unspecified: Secondary | ICD-10-CM | POA: Diagnosis not present

## 2018-03-07 DIAGNOSIS — J449 Chronic obstructive pulmonary disease, unspecified: Secondary | ICD-10-CM | POA: Diagnosis not present

## 2018-03-07 DIAGNOSIS — Z888 Allergy status to other drugs, medicaments and biological substances status: Secondary | ICD-10-CM | POA: Diagnosis not present

## 2018-03-07 DIAGNOSIS — R933 Abnormal findings on diagnostic imaging of other parts of digestive tract: Secondary | ICD-10-CM | POA: Diagnosis not present

## 2018-03-07 DIAGNOSIS — K625 Hemorrhage of anus and rectum: Secondary | ICD-10-CM | POA: Diagnosis not present

## 2018-03-07 DIAGNOSIS — Z886 Allergy status to analgesic agent status: Secondary | ICD-10-CM | POA: Diagnosis not present

## 2018-03-07 DIAGNOSIS — I10 Essential (primary) hypertension: Secondary | ICD-10-CM | POA: Diagnosis not present

## 2018-03-07 DIAGNOSIS — K640 First degree hemorrhoids: Secondary | ICD-10-CM | POA: Diagnosis not present

## 2018-03-07 DIAGNOSIS — M797 Fibromyalgia: Secondary | ICD-10-CM | POA: Diagnosis not present

## 2018-03-07 DIAGNOSIS — Z87891 Personal history of nicotine dependence: Secondary | ICD-10-CM | POA: Diagnosis not present

## 2018-03-07 DIAGNOSIS — G2581 Restless legs syndrome: Secondary | ICD-10-CM | POA: Diagnosis not present

## 2018-03-07 DIAGNOSIS — M069 Rheumatoid arthritis, unspecified: Secondary | ICD-10-CM | POA: Diagnosis not present

## 2018-03-07 DIAGNOSIS — M329 Systemic lupus erythematosus, unspecified: Secondary | ICD-10-CM | POA: Diagnosis not present

## 2018-03-07 DIAGNOSIS — Z79899 Other long term (current) drug therapy: Secondary | ICD-10-CM | POA: Diagnosis not present

## 2018-03-07 DIAGNOSIS — Z9049 Acquired absence of other specified parts of digestive tract: Secondary | ICD-10-CM | POA: Diagnosis not present

## 2018-03-07 DIAGNOSIS — K581 Irritable bowel syndrome with constipation: Secondary | ICD-10-CM | POA: Diagnosis not present

## 2018-03-07 DIAGNOSIS — Z90710 Acquired absence of both cervix and uterus: Secondary | ICD-10-CM | POA: Diagnosis not present

## 2018-03-07 DIAGNOSIS — Z7982 Long term (current) use of aspirin: Secondary | ICD-10-CM | POA: Diagnosis not present

## 2018-03-07 DIAGNOSIS — E039 Hypothyroidism, unspecified: Secondary | ICD-10-CM | POA: Diagnosis not present

## 2018-03-07 DIAGNOSIS — D126 Benign neoplasm of colon, unspecified: Secondary | ICD-10-CM | POA: Diagnosis not present

## 2018-03-07 DIAGNOSIS — K635 Polyp of colon: Secondary | ICD-10-CM | POA: Diagnosis not present

## 2018-03-07 DIAGNOSIS — D125 Benign neoplasm of sigmoid colon: Secondary | ICD-10-CM | POA: Diagnosis not present

## 2018-03-07 DIAGNOSIS — D122 Benign neoplasm of ascending colon: Secondary | ICD-10-CM | POA: Diagnosis not present

## 2018-03-07 DIAGNOSIS — K219 Gastro-esophageal reflux disease without esophagitis: Secondary | ICD-10-CM | POA: Diagnosis not present

## 2018-03-22 DIAGNOSIS — D126 Benign neoplasm of colon, unspecified: Secondary | ICD-10-CM | POA: Diagnosis not present

## 2018-03-29 DIAGNOSIS — M069 Rheumatoid arthritis, unspecified: Secondary | ICD-10-CM | POA: Diagnosis not present

## 2018-03-29 DIAGNOSIS — K5900 Constipation, unspecified: Secondary | ICD-10-CM | POA: Diagnosis not present

## 2018-03-29 DIAGNOSIS — M3500 Sicca syndrome, unspecified: Secondary | ICD-10-CM | POA: Diagnosis not present

## 2018-03-29 DIAGNOSIS — Z6828 Body mass index (BMI) 28.0-28.9, adult: Secondary | ICD-10-CM | POA: Diagnosis not present

## 2018-03-29 DIAGNOSIS — E1165 Type 2 diabetes mellitus with hyperglycemia: Secondary | ICD-10-CM | POA: Diagnosis not present

## 2018-03-29 DIAGNOSIS — I10 Essential (primary) hypertension: Secondary | ICD-10-CM | POA: Diagnosis not present

## 2018-03-29 DIAGNOSIS — Z299 Encounter for prophylactic measures, unspecified: Secondary | ICD-10-CM | POA: Diagnosis not present

## 2018-04-06 DIAGNOSIS — E2839 Other primary ovarian failure: Secondary | ICD-10-CM | POA: Diagnosis not present

## 2018-04-10 DIAGNOSIS — E041 Nontoxic single thyroid nodule: Secondary | ICD-10-CM | POA: Diagnosis not present

## 2018-04-10 DIAGNOSIS — E042 Nontoxic multinodular goiter: Secondary | ICD-10-CM | POA: Diagnosis not present

## 2018-04-26 DIAGNOSIS — Z299 Encounter for prophylactic measures, unspecified: Secondary | ICD-10-CM | POA: Diagnosis not present

## 2018-04-26 DIAGNOSIS — E041 Nontoxic single thyroid nodule: Secondary | ICD-10-CM | POA: Diagnosis not present

## 2018-04-26 DIAGNOSIS — M069 Rheumatoid arthritis, unspecified: Secondary | ICD-10-CM | POA: Diagnosis not present

## 2018-04-26 DIAGNOSIS — I10 Essential (primary) hypertension: Secondary | ICD-10-CM | POA: Diagnosis not present

## 2018-04-26 DIAGNOSIS — E039 Hypothyroidism, unspecified: Secondary | ICD-10-CM | POA: Diagnosis not present

## 2018-04-26 DIAGNOSIS — E1165 Type 2 diabetes mellitus with hyperglycemia: Secondary | ICD-10-CM | POA: Diagnosis not present

## 2018-04-26 DIAGNOSIS — Z6828 Body mass index (BMI) 28.0-28.9, adult: Secondary | ICD-10-CM | POA: Diagnosis not present

## 2018-04-28 DIAGNOSIS — M069 Rheumatoid arthritis, unspecified: Secondary | ICD-10-CM | POA: Diagnosis not present

## 2018-04-28 DIAGNOSIS — E039 Hypothyroidism, unspecified: Secondary | ICD-10-CM | POA: Diagnosis not present

## 2018-04-28 DIAGNOSIS — E041 Nontoxic single thyroid nodule: Secondary | ICD-10-CM | POA: Diagnosis not present

## 2018-05-10 DIAGNOSIS — J449 Chronic obstructive pulmonary disease, unspecified: Secondary | ICD-10-CM | POA: Diagnosis not present

## 2018-05-10 DIAGNOSIS — Z299 Encounter for prophylactic measures, unspecified: Secondary | ICD-10-CM | POA: Diagnosis not present

## 2018-05-10 DIAGNOSIS — Z6828 Body mass index (BMI) 28.0-28.9, adult: Secondary | ICD-10-CM | POA: Diagnosis not present

## 2018-05-10 DIAGNOSIS — E1165 Type 2 diabetes mellitus with hyperglycemia: Secondary | ICD-10-CM | POA: Diagnosis not present

## 2018-05-10 DIAGNOSIS — J019 Acute sinusitis, unspecified: Secondary | ICD-10-CM | POA: Diagnosis not present

## 2018-05-10 DIAGNOSIS — I10 Essential (primary) hypertension: Secondary | ICD-10-CM | POA: Diagnosis not present

## 2018-05-17 ENCOUNTER — Ambulatory Visit (INDEPENDENT_AMBULATORY_CARE_PROVIDER_SITE_OTHER): Payer: Medicare Other | Admitting: Urology

## 2018-05-17 DIAGNOSIS — R351 Nocturia: Secondary | ICD-10-CM

## 2018-05-17 DIAGNOSIS — R350 Frequency of micturition: Secondary | ICD-10-CM

## 2018-05-23 ENCOUNTER — Ambulatory Visit (INDEPENDENT_AMBULATORY_CARE_PROVIDER_SITE_OTHER): Payer: Medicare Other | Admitting: Allergy and Immunology

## 2018-05-23 ENCOUNTER — Encounter: Payer: Self-pay | Admitting: Allergy and Immunology

## 2018-05-23 VITALS — BP 148/72 | HR 76 | Temp 97.6°F | Resp 20 | Ht 65.5 in | Wt 177.8 lb

## 2018-05-23 DIAGNOSIS — J3089 Other allergic rhinitis: Secondary | ICD-10-CM

## 2018-05-23 DIAGNOSIS — J449 Chronic obstructive pulmonary disease, unspecified: Secondary | ICD-10-CM

## 2018-05-23 MED ORDER — MONTELUKAST SODIUM 10 MG PO TABS: 10.0000 mg | ORAL_TABLET | Freq: Every day | ORAL | 1 refills | Status: DC

## 2018-05-23 NOTE — Progress Notes (Signed)
Dear Dr. Manuella Ghazi,  Thank you for referring Jean Taylor to the [facility] on 05/23/2018.   Below is a summation of this patient's evaluation and recommendations.  Thank you for your referral. I will keep you informed about this patient's response to treatment.   If you have any questions please do not hesitate to contact me.   Sincerely,  Jiles Prows, MD Allergy / Immunology [place]   ______________________________________________________________________    NEW PATIENT NOTE  Referring Provider: Monico Blitz, MD Primary Provider: Monico Blitz, MD Date of office visit: 05/23/2018    Subjective:   Chief Complaint:  Jean Taylor (DOB: 09-Sep-1940) is a 77 y.o. female who presents to the clinic on 05/23/2018 with a chief complaint of New Patient (Initial Visit) .     HPI: Jean Taylor presents to this clinic in evaluation of allergies.  She has a long history of allergic disease having received immunotherapy from an allergist in the [place] area and discontinuing this form of therapy about 10 years ago.  She does believe that the use of this form of therapy resulted in very good control of both her upper and lower airway issues.  Over the course of the past several years she has developed more problems with sneezing and nasal congestion and coughing and wheezing and shortness of breath.  The symptoms occur on a perennial basis and flare during the spring and fall without any obvious precipitant.  In May of this year she apparently had respiratory failure and required hospitalization for COPD and possible aspiration pneumonia.  Since that point in time she has been consistently using a triple controller inhaler and believes that this helps her lower airway symptoms significantly.  She still must use a nebulizer in the mid afternoon when using this inhaler in the morning.  About 3 weeks ago she  developed a flareup of both her head and chest issue and she was treated with Levaquin and a systemic steroid and she is improved significantly although still has problems with nasal congestion and sneezing and coughing and wheezing and shortness of breath.  She has a history of rheumatoid arthritis and lupus and fibromyalgia of many decades duration.  It does not sound as though she has been treated with a biological agent for these issues.  It does not sound as though she uses much therapy other than tramadol at this point in time.  She has never been told that rheumatoid arthritis was affecting her lungs.  She smoked for 45 years which she discontinued in March 2019.  There is not really an unusual environmental trigger that may be giving rise to airway issues.  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  She does have reflux disease but this is under very good control on her proton pump inhibitor.  Past Medical History:  Diagnosis Date  . Anxiety   . Arthritis   . Asthma   . Blood transfusion without reported diagnosis   . Breast calcification, right 02/28/2013   Excised 03/08/13:B9 on path   . COPD (chronic obstructive pulmonary disease) ([place])   . Fibromyalgia   . GERD (gastroesophageal reflux disease)   . Hypertension   . Hypothyroidism   . IBS (irritable bowel syndrome)   . Lupus ([place])   .  Lupus ([place])   . Neuromuscular disorder ([place])   . Pneumonia   . Recurrent upper respiratory infection (URI)   . Rheumatoid arthritis ([place])   . RLS (restless legs syndrome)   . Thyroid disease   . Wears dentures    top-lower partial    Past Surgical History:  Procedure Laterality Date  . ABDOMINAL HYSTERECTOMY    . BREAST BIOPSY Right 03/08/2013   Procedure:  NEEDLE LOCALIZATION REMOVAL RIGHT BREAST CALCIFICATIONS;  Surgeon: Haywood Lasso, MD;  Location: [facility];  Service: General;  Laterality: Right;  . CHOLECYSTECTOMY    . EYE SURGERY Bilateral    cataract surgery with lens implant  . KNEE ARTHROSCOPY Left   . MINIMALLY INVASIVE MICRODISCECTOMY LUMBAR SPINE    . NASAL SEPTUM SURGERY    . RADIOLOGY WITH ANESTHESIA N/A 07/10/2015   Procedure: MRI;  Surgeon: Medication Radiologist, MD;  Location: [facility];  Service: Radiology;  Laterality: N/A;    Allergies as of 05/23/2018      Reactions   Demerol [meperidine]    Hallucinations   Morphine And Related Nausea And Vomiting      Medication List      albuterol 108 (90 Base) MCG/ACT inhaler Commonly known as:  PROVENTIL HFA;VENTOLIN HFA Inhale 2 puffs into the lungs every 6 (six) hours as needed for wheezing.   amLODipine 5 MG tablet Commonly known as:  NORVASC Take 5 mg by mouth daily.   aspirin 81 MG tablet Take 81 mg by mouth daily.   DSS 100 MG Caps Take by mouth.   gabapentin 300 MG capsule Commonly known as:  NEURONTIN Take 300 mg by mouth 3 (three) times daily.   levothyroxine 50 MCG tablet Commonly known as:  SYNTHROID, LEVOTHROID 50 mcg daily before breakfast.   LINZESS 145 MCG Caps capsule Generic drug:  linaclotide Take 145 mcg by mouth daily.   LORazepam 1 MG tablet Commonly known as:  ATIVAN Take 0.5-1 mg by mouth 2 (two) times daily. Takes 1mg  in AM and if anxious in afternoon will taken 0.5mg    losartan 100 MG tablet Commonly known as:  COZAAR Take 100 mg by mouth daily.   magnesium gluconate 500 MG tablet Commonly known as:  MAGONATE Take 500 mg by mouth daily.   multivitamin with minerals tablet Take 1 tablet by mouth daily.   omeprazole 20 MG capsule Commonly known as:  PRILOSEC Take 40 mg by mouth daily.   traMADol 50 MG tablet Commonly known as:  ULTRAM 50-100 mg every 12 (twelve) hours as needed (can take 2 tablets three times a day if needed. Pt takes 1 daily (sometimes will take 2 if needed)).   TRELEGY ELLIPTA 100-62.5-25  MCG/INH Aepb Generic drug:  Fluticasone-Umeclidin-Vilant       Review of systems negative except as noted in HPI / PMHx or noted below:  Review of Systems  Constitutional: Negative.   HENT: Negative.   Eyes: Negative.   Respiratory: Negative.   Cardiovascular: Negative.   Gastrointestinal: Negative.   Genitourinary: Negative.   Musculoskeletal: Negative.   Skin: Negative.   Neurological: Negative.   Endo/Heme/Allergies: Negative.   Psychiatric/Behavioral: Negative.     Family History  Problem Relation Age of Onset  . Cancer Mother        breast  . Allergic rhinitis Neg Hx   . Asthma Neg Hx   . Eczema Neg Hx   . Immunodeficiency Neg Hx     Social History   Socioeconomic  History  . Marital status: Married    Spouse name: Not on file  . Number of children: Not on file  . Years of education: Not on file  . Highest education level: Not on file  Occupational History  . Not on file  Social Needs  . Financial resource strain: Not on file  . Food insecurity:    Worry: Not on file    Inability: Not on file  . Transportation needs:    Medical: Not on file    Non-medical: Not on file  Tobacco Use  . Smoking status: Former Smoker    Packs/day: 0.50    Types: Cigarettes    Start date: 12/08/1960    Last attempt to quit: 11/19/2017    Years since quitting: 0.5  . Smokeless tobacco: Never Used  Substance and Sexual Activity  . Alcohol use: No  . Drug use: No  . Sexual activity: Not on file  Lifestyle  . Physical activity:    Days per week: Not on file    Minutes per session: Not on file  . Stress: Not on file  Relationships  . Social connections:    Talks on phone: Not on file    Gets together: Not on file    Attends religious service: Not on file    Active member of club or organization: Not on file    Attends meetings of clubs or organizations: Not on file    Relationship status: Not on file  . Intimate partner violence:    Fear of current or ex partner:  Not on file    Emotionally abused: Not on file    Physically abused: Not on file    Forced sexual activity: Not on file  Other Topics Concern  . Not on file  Social History Narrative  . Not on file    Environmental and Social history  Lives in a house with a dry environment, a dog located inside the household, no carpet in the bedroom, no plastic on the bed, no plastic on the pillow, and no smokers located inside the household.  Objective:   Vitals:   05/23/18 1335  BP: (!) 148/72  Pulse: 76  Resp: 20  Temp: 97.6 F (36.4 C)  SpO2: 93%   Height: 5' 5.5" (166.4 cm) Weight: 177 lb 12.8 oz (80.6 kg)  Physical Exam  Constitutional:  Nasal voice  HENT:  Head: Normocephalic. Head is without right periorbital erythema and without left periorbital erythema.  Right Ear: Tympanic membrane, external ear and ear canal normal.  Left Ear: Tympanic membrane, external ear and ear canal normal.  Nose: Mucosal edema (Erythematous) present. No rhinorrhea.  Mouth/Throat: Oropharynx is clear and moist and mucous membranes are normal. No oropharyngeal exudate.  Eyes: Pupils are equal, round, and reactive to light. Conjunctivae and lids are normal.  Neck: Trachea normal. No tracheal deviation present. No thyromegaly present.  Cardiovascular: Normal rate, regular rhythm, S1 normal, S2 normal and normal heart sounds.  No murmur heard. Pulmonary/Chest: Effort normal. No stridor. No respiratory distress. She has no wheezes. She has no rales. She exhibits no tenderness.  Abdominal: Soft. She exhibits no distension and no mass. There is no hepatosplenomegaly. There is no tenderness. There is no rebound and no guarding.  Musculoskeletal: She exhibits no edema or tenderness.  Lymphadenopathy:       Head (right side): No tonsillar adenopathy present.       Head (left side): No tonsillar adenopathy present.    She  has no cervical adenopathy.    She has no axillary adenopathy.  Neurological: She is  alert.  Skin: No rash noted. She is not diaphoretic. No erythema. No pallor. Nails show no clubbing.    Diagnostics: Allergy skin tests were performed.  She demonstrated hypersensitivity to cat, dog, dust mite, and slight hypersensitivity against grass, ragweed, and weed mix.  Spirometry was performed and demonstrated an FEV1 of 1.09 @ 49 % of predicted. FEV1/FVC = 0.54.  Following administration of nebulized albuterol her FEV1 did not change significantly   Assessment and Plan:    1. COPD with asthma ([place])   2. Perennial allergic rhinitis     1.  Allergen avoidance measures  2.  Treat and prevent inflammation:   A. Trelergy - one inhalation every morning  B.  OTC Nasacort - one spray each nostril every morning  C.  Montelukast 10 mg -1 tablet 1 time per day  3.  If needed:   A.  Albuterol nebulization or Pro Air HFA 2 puffs every 4-6 hours  B.  Nasal saline wash  C.  Cetirizine 10 mg -1 tablet 1 time per day  4.  Consider a course of immunotherapy  5.  Return to clinic in 4 weeks or earlier if problem  6.  Obtain fall flu vaccine  7.  Review recent chest x-ray and CT scan  It sounds as though Kaydin does have some degree of atopic disease driving some of her respiratory tract inflammation but there is also a prolonged history of tobacco abuse which is probably changed the architecture and physiology of her lower airways as well.  We will get her to perform allergen avoidance measures and use anti-inflammatory agents for her airway as noted above.  I have given her some literature on immunotherapy and she is presently considering this option.  I will see her back in this clinic in 4 weeks or earlier to assess her response to this approach.  Jiles Prows, MD Allergy / Immunology [facility] [place]

## 2018-05-23 NOTE — Patient Instructions (Addendum)
  1.  Allergen avoidance measures  2.  Treat and prevent inflammation:   A. Trelergy - one inhalation every morning  B.  OTC Nasacort - one spray each nostril every morning  C.  Montelukast 10 mg -1 tablet 1 time per day  3.  If needed:   A.  Albuterol nebulization or Pro Air HFA 2 puffs every 4-6 hours  B.  Nasal saline wash  C.  Cetirizine 10 mg -1 tablet 1 time per day  4.  Consider a course of immunotherapy  5.  Return to clinic in 4 weeks or earlier if problem  6.  Obtain fall flu vaccine  7.  Review recent chest x-ray and CT scan

## 2018-05-24 ENCOUNTER — Encounter: Payer: Self-pay | Admitting: Allergy and Immunology

## 2018-06-08 DIAGNOSIS — Z23 Encounter for immunization: Secondary | ICD-10-CM | POA: Diagnosis not present

## 2018-06-26 DIAGNOSIS — Z6829 Body mass index (BMI) 29.0-29.9, adult: Secondary | ICD-10-CM | POA: Diagnosis not present

## 2018-06-26 DIAGNOSIS — M2550 Pain in unspecified joint: Secondary | ICD-10-CM | POA: Diagnosis not present

## 2018-06-26 DIAGNOSIS — E663 Overweight: Secondary | ICD-10-CM | POA: Diagnosis not present

## 2018-07-07 DIAGNOSIS — R252 Cramp and spasm: Secondary | ICD-10-CM | POA: Diagnosis not present

## 2018-07-07 DIAGNOSIS — I10 Essential (primary) hypertension: Secondary | ICD-10-CM | POA: Diagnosis not present

## 2018-07-07 DIAGNOSIS — E1165 Type 2 diabetes mellitus with hyperglycemia: Secondary | ICD-10-CM | POA: Diagnosis not present

## 2018-07-07 DIAGNOSIS — Z6828 Body mass index (BMI) 28.0-28.9, adult: Secondary | ICD-10-CM | POA: Diagnosis not present

## 2018-07-07 DIAGNOSIS — G479 Sleep disorder, unspecified: Secondary | ICD-10-CM | POA: Diagnosis not present

## 2018-07-07 DIAGNOSIS — R350 Frequency of micturition: Secondary | ICD-10-CM | POA: Diagnosis not present

## 2018-07-07 DIAGNOSIS — Z299 Encounter for prophylactic measures, unspecified: Secondary | ICD-10-CM | POA: Diagnosis not present

## 2018-07-11 ENCOUNTER — Ambulatory Visit: Payer: Medicare Other | Admitting: Allergy and Immunology

## 2018-07-12 ENCOUNTER — Ambulatory Visit (INDEPENDENT_AMBULATORY_CARE_PROVIDER_SITE_OTHER): Payer: Medicare Other | Admitting: Urology

## 2018-07-12 DIAGNOSIS — R351 Nocturia: Secondary | ICD-10-CM

## 2018-07-12 DIAGNOSIS — R350 Frequency of micturition: Secondary | ICD-10-CM | POA: Diagnosis not present

## 2018-07-13 DIAGNOSIS — M0579 Rheumatoid arthritis with rheumatoid factor of multiple sites without organ or systems involvement: Secondary | ICD-10-CM | POA: Diagnosis not present

## 2018-07-13 DIAGNOSIS — E669 Obesity, unspecified: Secondary | ICD-10-CM | POA: Diagnosis not present

## 2018-07-13 DIAGNOSIS — Z6830 Body mass index (BMI) 30.0-30.9, adult: Secondary | ICD-10-CM | POA: Diagnosis not present

## 2018-07-26 DIAGNOSIS — M47816 Spondylosis without myelopathy or radiculopathy, lumbar region: Secondary | ICD-10-CM | POA: Diagnosis not present

## 2018-07-26 DIAGNOSIS — M545 Low back pain: Secondary | ICD-10-CM | POA: Diagnosis not present

## 2018-08-10 DIAGNOSIS — Z87891 Personal history of nicotine dependence: Secondary | ICD-10-CM | POA: Diagnosis not present

## 2018-08-10 DIAGNOSIS — J069 Acute upper respiratory infection, unspecified: Secondary | ICD-10-CM | POA: Diagnosis not present

## 2018-08-10 DIAGNOSIS — Z299 Encounter for prophylactic measures, unspecified: Secondary | ICD-10-CM | POA: Diagnosis not present

## 2018-08-10 DIAGNOSIS — N390 Urinary tract infection, site not specified: Secondary | ICD-10-CM | POA: Diagnosis not present

## 2018-08-10 DIAGNOSIS — I10 Essential (primary) hypertension: Secondary | ICD-10-CM | POA: Diagnosis not present

## 2018-08-10 DIAGNOSIS — J449 Chronic obstructive pulmonary disease, unspecified: Secondary | ICD-10-CM | POA: Diagnosis not present

## 2018-08-10 DIAGNOSIS — Z6830 Body mass index (BMI) 30.0-30.9, adult: Secondary | ICD-10-CM | POA: Diagnosis not present

## 2018-08-23 DIAGNOSIS — J189 Pneumonia, unspecified organism: Secondary | ICD-10-CM

## 2018-08-23 HISTORY — DX: Pneumonia, unspecified organism: J18.9

## 2018-09-04 DIAGNOSIS — R350 Frequency of micturition: Secondary | ICD-10-CM | POA: Diagnosis not present

## 2018-09-04 DIAGNOSIS — G479 Sleep disorder, unspecified: Secondary | ICD-10-CM | POA: Diagnosis not present

## 2018-09-04 DIAGNOSIS — G4733 Obstructive sleep apnea (adult) (pediatric): Secondary | ICD-10-CM | POA: Diagnosis not present

## 2018-09-04 DIAGNOSIS — R351 Nocturia: Secondary | ICD-10-CM | POA: Diagnosis not present

## 2018-09-07 DIAGNOSIS — R531 Weakness: Secondary | ICD-10-CM | POA: Diagnosis not present

## 2018-09-07 DIAGNOSIS — R112 Nausea with vomiting, unspecified: Secondary | ICD-10-CM | POA: Diagnosis not present

## 2018-09-07 DIAGNOSIS — J189 Pneumonia, unspecified organism: Secondary | ICD-10-CM | POA: Diagnosis not present

## 2018-09-07 DIAGNOSIS — E039 Hypothyroidism, unspecified: Secondary | ICD-10-CM | POA: Diagnosis present

## 2018-09-07 DIAGNOSIS — J13 Pneumonia due to Streptococcus pneumoniae: Secondary | ICD-10-CM | POA: Diagnosis present

## 2018-09-07 DIAGNOSIS — D638 Anemia in other chronic diseases classified elsewhere: Secondary | ICD-10-CM | POA: Diagnosis present

## 2018-09-07 DIAGNOSIS — R509 Fever, unspecified: Secondary | ICD-10-CM | POA: Diagnosis not present

## 2018-09-07 DIAGNOSIS — Z7982 Long term (current) use of aspirin: Secondary | ICD-10-CM | POA: Diagnosis not present

## 2018-09-07 DIAGNOSIS — J90 Pleural effusion, not elsewhere classified: Secondary | ICD-10-CM | POA: Diagnosis not present

## 2018-09-07 DIAGNOSIS — M797 Fibromyalgia: Secondary | ICD-10-CM | POA: Diagnosis present

## 2018-09-07 DIAGNOSIS — J9611 Chronic respiratory failure with hypoxia: Secondary | ICD-10-CM | POA: Diagnosis not present

## 2018-09-07 DIAGNOSIS — E871 Hypo-osmolality and hyponatremia: Secondary | ICD-10-CM | POA: Diagnosis not present

## 2018-09-07 DIAGNOSIS — M069 Rheumatoid arthritis, unspecified: Secondary | ICD-10-CM | POA: Diagnosis present

## 2018-09-07 DIAGNOSIS — L02612 Cutaneous abscess of left foot: Secondary | ICD-10-CM | POA: Diagnosis not present

## 2018-09-07 DIAGNOSIS — I10 Essential (primary) hypertension: Secondary | ICD-10-CM | POA: Diagnosis present

## 2018-09-07 DIAGNOSIS — J181 Lobar pneumonia, unspecified organism: Secondary | ICD-10-CM | POA: Diagnosis not present

## 2018-09-07 DIAGNOSIS — E1140 Type 2 diabetes mellitus with diabetic neuropathy, unspecified: Secondary | ICD-10-CM | POA: Diagnosis not present

## 2018-09-07 DIAGNOSIS — M329 Systemic lupus erythematosus, unspecified: Secondary | ICD-10-CM | POA: Diagnosis present

## 2018-09-07 DIAGNOSIS — Z79899 Other long term (current) drug therapy: Secondary | ICD-10-CM | POA: Diagnosis not present

## 2018-09-07 DIAGNOSIS — L03116 Cellulitis of left lower limb: Secondary | ICD-10-CM | POA: Diagnosis not present

## 2018-09-07 DIAGNOSIS — J440 Chronic obstructive pulmonary disease with acute lower respiratory infection: Secondary | ICD-10-CM | POA: Diagnosis present

## 2018-09-07 DIAGNOSIS — R41841 Cognitive communication deficit: Secondary | ICD-10-CM | POA: Diagnosis not present

## 2018-09-07 DIAGNOSIS — K5900 Constipation, unspecified: Secondary | ICD-10-CM | POA: Diagnosis not present

## 2018-09-07 DIAGNOSIS — M170 Bilateral primary osteoarthritis of knee: Secondary | ICD-10-CM | POA: Diagnosis not present

## 2018-09-07 DIAGNOSIS — R2689 Other abnormalities of gait and mobility: Secondary | ICD-10-CM | POA: Diagnosis not present

## 2018-09-07 DIAGNOSIS — E119 Type 2 diabetes mellitus without complications: Secondary | ICD-10-CM | POA: Diagnosis present

## 2018-09-07 DIAGNOSIS — Z87891 Personal history of nicotine dependence: Secondary | ICD-10-CM | POA: Diagnosis not present

## 2018-09-07 DIAGNOSIS — I712 Thoracic aortic aneurysm, without rupture: Secondary | ICD-10-CM | POA: Diagnosis not present

## 2018-09-07 DIAGNOSIS — Z9981 Dependence on supplemental oxygen: Secondary | ICD-10-CM | POA: Diagnosis not present

## 2018-09-07 DIAGNOSIS — M6281 Muscle weakness (generalized): Secondary | ICD-10-CM | POA: Diagnosis not present

## 2018-09-07 DIAGNOSIS — J441 Chronic obstructive pulmonary disease with (acute) exacerbation: Secondary | ICD-10-CM | POA: Diagnosis present

## 2018-09-15 DIAGNOSIS — I10 Essential (primary) hypertension: Secondary | ICD-10-CM | POA: Diagnosis not present

## 2018-09-15 DIAGNOSIS — L03116 Cellulitis of left lower limb: Secondary | ICD-10-CM | POA: Diagnosis not present

## 2018-09-15 DIAGNOSIS — Z9981 Dependence on supplemental oxygen: Secondary | ICD-10-CM | POA: Diagnosis not present

## 2018-09-15 DIAGNOSIS — J189 Pneumonia, unspecified organism: Secondary | ICD-10-CM | POA: Diagnosis not present

## 2018-09-15 DIAGNOSIS — J9611 Chronic respiratory failure with hypoxia: Secondary | ICD-10-CM | POA: Diagnosis not present

## 2018-09-15 DIAGNOSIS — M170 Bilateral primary osteoarthritis of knee: Secondary | ICD-10-CM | POA: Diagnosis not present

## 2018-09-15 DIAGNOSIS — R531 Weakness: Secondary | ICD-10-CM | POA: Diagnosis not present

## 2018-09-15 DIAGNOSIS — R05 Cough: Secondary | ICD-10-CM | POA: Diagnosis not present

## 2018-09-15 DIAGNOSIS — J13 Pneumonia due to Streptococcus pneumoniae: Secondary | ICD-10-CM | POA: Diagnosis not present

## 2018-09-15 DIAGNOSIS — R41841 Cognitive communication deficit: Secondary | ICD-10-CM | POA: Diagnosis not present

## 2018-09-15 DIAGNOSIS — L02612 Cutaneous abscess of left foot: Secondary | ICD-10-CM | POA: Diagnosis not present

## 2018-09-15 DIAGNOSIS — J449 Chronic obstructive pulmonary disease, unspecified: Secondary | ICD-10-CM | POA: Diagnosis not present

## 2018-09-15 DIAGNOSIS — I719 Aortic aneurysm of unspecified site, without rupture: Secondary | ICD-10-CM | POA: Diagnosis not present

## 2018-09-15 DIAGNOSIS — E1140 Type 2 diabetes mellitus with diabetic neuropathy, unspecified: Secondary | ICD-10-CM | POA: Diagnosis not present

## 2018-09-15 DIAGNOSIS — M6281 Muscle weakness (generalized): Secondary | ICD-10-CM | POA: Diagnosis not present

## 2018-09-15 DIAGNOSIS — R2689 Other abnormalities of gait and mobility: Secondary | ICD-10-CM | POA: Diagnosis not present

## 2018-09-15 DIAGNOSIS — J441 Chronic obstructive pulmonary disease with (acute) exacerbation: Secondary | ICD-10-CM | POA: Diagnosis not present

## 2018-09-15 DIAGNOSIS — J440 Chronic obstructive pulmonary disease with acute lower respiratory infection: Secondary | ICD-10-CM | POA: Diagnosis not present

## 2018-09-18 DIAGNOSIS — J189 Pneumonia, unspecified organism: Secondary | ICD-10-CM | POA: Diagnosis not present

## 2018-09-18 DIAGNOSIS — J9611 Chronic respiratory failure with hypoxia: Secondary | ICD-10-CM | POA: Diagnosis not present

## 2018-09-18 DIAGNOSIS — I719 Aortic aneurysm of unspecified site, without rupture: Secondary | ICD-10-CM | POA: Diagnosis not present

## 2018-09-18 DIAGNOSIS — J440 Chronic obstructive pulmonary disease with acute lower respiratory infection: Secondary | ICD-10-CM | POA: Diagnosis not present

## 2018-09-19 DIAGNOSIS — R531 Weakness: Secondary | ICD-10-CM | POA: Diagnosis not present

## 2018-09-19 DIAGNOSIS — J189 Pneumonia, unspecified organism: Secondary | ICD-10-CM | POA: Diagnosis not present

## 2018-09-19 DIAGNOSIS — J449 Chronic obstructive pulmonary disease, unspecified: Secondary | ICD-10-CM | POA: Diagnosis not present

## 2018-09-30 DIAGNOSIS — J189 Pneumonia, unspecified organism: Secondary | ICD-10-CM | POA: Diagnosis not present

## 2018-09-30 DIAGNOSIS — J449 Chronic obstructive pulmonary disease, unspecified: Secondary | ICD-10-CM | POA: Diagnosis not present

## 2018-09-30 DIAGNOSIS — R05 Cough: Secondary | ICD-10-CM | POA: Diagnosis not present

## 2018-10-11 DIAGNOSIS — J189 Pneumonia, unspecified organism: Secondary | ICD-10-CM | POA: Diagnosis not present

## 2018-10-11 DIAGNOSIS — Z6829 Body mass index (BMI) 29.0-29.9, adult: Secondary | ICD-10-CM | POA: Diagnosis not present

## 2018-10-11 DIAGNOSIS — I10 Essential (primary) hypertension: Secondary | ICD-10-CM | POA: Diagnosis not present

## 2018-10-11 DIAGNOSIS — Z299 Encounter for prophylactic measures, unspecified: Secondary | ICD-10-CM | POA: Diagnosis not present

## 2018-10-11 DIAGNOSIS — I712 Thoracic aortic aneurysm, without rupture: Secondary | ICD-10-CM | POA: Diagnosis not present

## 2018-10-11 DIAGNOSIS — E1165 Type 2 diabetes mellitus with hyperglycemia: Secondary | ICD-10-CM | POA: Diagnosis not present

## 2018-10-11 DIAGNOSIS — J449 Chronic obstructive pulmonary disease, unspecified: Secondary | ICD-10-CM | POA: Diagnosis not present

## 2018-10-27 DIAGNOSIS — J209 Acute bronchitis, unspecified: Secondary | ICD-10-CM | POA: Diagnosis not present

## 2018-10-27 DIAGNOSIS — J440 Chronic obstructive pulmonary disease with acute lower respiratory infection: Secondary | ICD-10-CM | POA: Diagnosis not present

## 2018-11-01 DIAGNOSIS — R05 Cough: Secondary | ICD-10-CM | POA: Diagnosis not present

## 2018-11-01 DIAGNOSIS — J189 Pneumonia, unspecified organism: Secondary | ICD-10-CM | POA: Diagnosis not present

## 2018-11-01 DIAGNOSIS — R918 Other nonspecific abnormal finding of lung field: Secondary | ICD-10-CM | POA: Diagnosis not present

## 2018-11-16 ENCOUNTER — Other Ambulatory Visit: Payer: Self-pay

## 2018-11-16 ENCOUNTER — Telehealth (INDEPENDENT_AMBULATORY_CARE_PROVIDER_SITE_OTHER): Payer: Medicare Other | Admitting: Vascular Surgery

## 2018-11-16 DIAGNOSIS — I712 Thoracic aortic aneurysm, without rupture: Secondary | ICD-10-CM | POA: Diagnosis not present

## 2018-11-16 NOTE — Progress Notes (Addendum)
Virtual Visit via Telephone Note  I connected with Jean Taylor on 11/16/18 at  2:30 PM EDT by telephone and verified that I am speaking with the correct person using two identifiers.  I was at our office on [facility] for this phone call.  The patient was at home.   Phone visit was performed today with the patient due to recent [place].  This was done to limit patient's exposure to the medical community.  Patient currently is not experiencing any severe back abdominal or chest pain.  I reviewed the CT images from a CT scan of the chest dated November 06, 2018 at [facility].  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  Past Medical History:  Diagnosis Date  . Anxiety   . Arthritis   . Asthma   . Blood transfusion without reported diagnosis   . Breast calcification, right 02/28/2013   Excised 03/08/13:B9 on path   . COPD (chronic obstructive pulmonary disease) ([place])   . Fibromyalgia   . GERD (gastroesophageal reflux disease)   . Hypertension   . Hypothyroidism   . IBS (irritable bowel syndrome)   . Lupus ([place])   . Lupus ([place])   . Neuromuscular disorder ([place])   . Pneumonia   . Recurrent upper respiratory infection (URI)   . Rheumatoid arthritis ([place])   . RLS (restless legs syndrome)   . Thyroid disease   . 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  Additionally the ascending aortic aneurysm would be addressed if it was greater than 6 cm in size.  However if the  ascending aortic dissection became enlarged this would need to be evaluated by cardiothoracic surgery as this is not part of our operative territory.  Follow Up Instructions: Patient will have a follow-up CT angio of the chest abdomen and pelvis in 1 year.  She will see me at an office visit at that time.  She will report to the emergency room if she develops any severe chest abdomen or back pain and let them know she has an aneurysm.   I discussed the assessment and treatment plan with the patient. The patient was provided an opportunity to ask questions and all were answered. The patient agreed with the plan and demonstrated an understanding of the instructions.   The patient was advised to call back or seek an in-person evaluation if the symptoms worsen or if the condition fails to improve as anticipated.  I provided 5 minutes of non-face-to-face time during this encounter.   Ruta Hinds, MD

## 2018-12-12 DIAGNOSIS — R252 Cramp and spasm: Secondary | ICD-10-CM | POA: Diagnosis not present

## 2018-12-12 DIAGNOSIS — J309 Allergic rhinitis, unspecified: Secondary | ICD-10-CM | POA: Diagnosis not present

## 2018-12-12 DIAGNOSIS — Z299 Encounter for prophylactic measures, unspecified: Secondary | ICD-10-CM | POA: Diagnosis not present

## 2018-12-12 DIAGNOSIS — F17210 Nicotine dependence, cigarettes, uncomplicated: Secondary | ICD-10-CM | POA: Diagnosis not present

## 2018-12-12 DIAGNOSIS — I10 Essential (primary) hypertension: Secondary | ICD-10-CM | POA: Diagnosis not present

## 2018-12-29 DIAGNOSIS — I10 Essential (primary) hypertension: Secondary | ICD-10-CM | POA: Diagnosis not present

## 2018-12-29 DIAGNOSIS — Z87891 Personal history of nicotine dependence: Secondary | ICD-10-CM | POA: Diagnosis not present

## 2018-12-29 DIAGNOSIS — R300 Dysuria: Secondary | ICD-10-CM | POA: Diagnosis not present

## 2018-12-29 DIAGNOSIS — E1165 Type 2 diabetes mellitus with hyperglycemia: Secondary | ICD-10-CM | POA: Diagnosis not present

## 2018-12-29 DIAGNOSIS — Z299 Encounter for prophylactic measures, unspecified: Secondary | ICD-10-CM | POA: Diagnosis not present

## 2018-12-29 DIAGNOSIS — J449 Chronic obstructive pulmonary disease, unspecified: Secondary | ICD-10-CM | POA: Diagnosis not present

## 2019-01-16 DIAGNOSIS — Z299 Encounter for prophylactic measures, unspecified: Secondary | ICD-10-CM | POA: Diagnosis not present

## 2019-01-16 DIAGNOSIS — G2581 Restless legs syndrome: Secondary | ICD-10-CM | POA: Diagnosis not present

## 2019-01-16 DIAGNOSIS — I10 Essential (primary) hypertension: Secondary | ICD-10-CM | POA: Diagnosis not present

## 2019-01-16 DIAGNOSIS — E1165 Type 2 diabetes mellitus with hyperglycemia: Secondary | ICD-10-CM | POA: Diagnosis not present

## 2019-01-16 DIAGNOSIS — M797 Fibromyalgia: Secondary | ICD-10-CM | POA: Diagnosis not present

## 2019-02-13 DIAGNOSIS — I10 Essential (primary) hypertension: Secondary | ICD-10-CM | POA: Diagnosis not present

## 2019-02-13 DIAGNOSIS — M545 Low back pain: Secondary | ICD-10-CM | POA: Diagnosis not present

## 2019-02-13 DIAGNOSIS — Z6832 Body mass index (BMI) 32.0-32.9, adult: Secondary | ICD-10-CM | POA: Diagnosis not present

## 2019-02-13 DIAGNOSIS — M47816 Spondylosis without myelopathy or radiculopathy, lumbar region: Secondary | ICD-10-CM | POA: Diagnosis not present

## 2019-02-21 DIAGNOSIS — M47816 Spondylosis without myelopathy or radiculopathy, lumbar region: Secondary | ICD-10-CM | POA: Diagnosis not present

## 2019-02-22 DIAGNOSIS — E1165 Type 2 diabetes mellitus with hyperglycemia: Secondary | ICD-10-CM | POA: Diagnosis not present

## 2019-02-22 DIAGNOSIS — M069 Rheumatoid arthritis, unspecified: Secondary | ICD-10-CM | POA: Diagnosis not present

## 2019-02-22 DIAGNOSIS — Z299 Encounter for prophylactic measures, unspecified: Secondary | ICD-10-CM | POA: Diagnosis not present

## 2019-02-22 DIAGNOSIS — R600 Localized edema: Secondary | ICD-10-CM | POA: Diagnosis not present

## 2019-02-22 DIAGNOSIS — J449 Chronic obstructive pulmonary disease, unspecified: Secondary | ICD-10-CM | POA: Diagnosis not present

## 2019-02-22 DIAGNOSIS — Z6832 Body mass index (BMI) 32.0-32.9, adult: Secondary | ICD-10-CM | POA: Diagnosis not present

## 2019-02-22 DIAGNOSIS — I10 Essential (primary) hypertension: Secondary | ICD-10-CM | POA: Diagnosis not present

## 2019-03-01 DIAGNOSIS — Z1211 Encounter for screening for malignant neoplasm of colon: Secondary | ICD-10-CM | POA: Diagnosis not present

## 2019-03-01 DIAGNOSIS — Z1331 Encounter for screening for depression: Secondary | ICD-10-CM | POA: Diagnosis not present

## 2019-03-01 DIAGNOSIS — Z0000 Encounter for general adult medical examination without abnormal findings: Secondary | ICD-10-CM | POA: Diagnosis not present

## 2019-03-01 DIAGNOSIS — Z299 Encounter for prophylactic measures, unspecified: Secondary | ICD-10-CM | POA: Diagnosis not present

## 2019-03-01 DIAGNOSIS — Z6832 Body mass index (BMI) 32.0-32.9, adult: Secondary | ICD-10-CM | POA: Diagnosis not present

## 2019-03-01 DIAGNOSIS — E7800 Pure hypercholesterolemia, unspecified: Secondary | ICD-10-CM | POA: Diagnosis not present

## 2019-03-01 DIAGNOSIS — I10 Essential (primary) hypertension: Secondary | ICD-10-CM | POA: Diagnosis not present

## 2019-03-01 DIAGNOSIS — Z1339 Encounter for screening examination for other mental health and behavioral disorders: Secondary | ICD-10-CM | POA: Diagnosis not present

## 2019-03-01 DIAGNOSIS — R5383 Other fatigue: Secondary | ICD-10-CM | POA: Diagnosis not present

## 2019-03-01 DIAGNOSIS — Z7189 Other specified counseling: Secondary | ICD-10-CM | POA: Diagnosis not present

## 2019-03-01 DIAGNOSIS — Z79899 Other long term (current) drug therapy: Secondary | ICD-10-CM | POA: Diagnosis not present

## 2019-03-01 DIAGNOSIS — E039 Hypothyroidism, unspecified: Secondary | ICD-10-CM | POA: Diagnosis not present

## 2019-03-09 DIAGNOSIS — J449 Chronic obstructive pulmonary disease, unspecified: Secondary | ICD-10-CM | POA: Diagnosis not present

## 2019-03-09 DIAGNOSIS — Z6832 Body mass index (BMI) 32.0-32.9, adult: Secondary | ICD-10-CM | POA: Diagnosis not present

## 2019-03-09 DIAGNOSIS — R600 Localized edema: Secondary | ICD-10-CM | POA: Diagnosis not present

## 2019-03-09 DIAGNOSIS — I10 Essential (primary) hypertension: Secondary | ICD-10-CM | POA: Diagnosis not present

## 2019-03-09 DIAGNOSIS — Z299 Encounter for prophylactic measures, unspecified: Secondary | ICD-10-CM | POA: Diagnosis not present

## 2019-03-21 DIAGNOSIS — I10 Essential (primary) hypertension: Secondary | ICD-10-CM | POA: Diagnosis not present

## 2019-04-18 DIAGNOSIS — Z6831 Body mass index (BMI) 31.0-31.9, adult: Secondary | ICD-10-CM | POA: Diagnosis not present

## 2019-04-18 DIAGNOSIS — Z299 Encounter for prophylactic measures, unspecified: Secondary | ICD-10-CM | POA: Diagnosis not present

## 2019-04-18 DIAGNOSIS — I10 Essential (primary) hypertension: Secondary | ICD-10-CM | POA: Diagnosis not present

## 2019-04-18 DIAGNOSIS — M545 Low back pain: Secondary | ICD-10-CM | POA: Diagnosis not present

## 2019-04-18 DIAGNOSIS — I712 Thoracic aortic aneurysm, without rupture: Secondary | ICD-10-CM | POA: Diagnosis not present

## 2019-04-18 DIAGNOSIS — J449 Chronic obstructive pulmonary disease, unspecified: Secondary | ICD-10-CM | POA: Diagnosis not present

## 2019-04-18 DIAGNOSIS — E1165 Type 2 diabetes mellitus with hyperglycemia: Secondary | ICD-10-CM | POA: Diagnosis not present

## 2019-04-23 DIAGNOSIS — I10 Essential (primary) hypertension: Secondary | ICD-10-CM | POA: Diagnosis not present

## 2019-04-23 DIAGNOSIS — E7800 Pure hypercholesterolemia, unspecified: Secondary | ICD-10-CM | POA: Diagnosis not present

## 2019-04-23 DIAGNOSIS — E119 Type 2 diabetes mellitus without complications: Secondary | ICD-10-CM | POA: Diagnosis not present

## 2019-04-23 DIAGNOSIS — E039 Hypothyroidism, unspecified: Secondary | ICD-10-CM | POA: Diagnosis not present

## 2019-05-11 DIAGNOSIS — M5136 Other intervertebral disc degeneration, lumbar region: Secondary | ICD-10-CM | POA: Diagnosis not present

## 2019-05-11 DIAGNOSIS — I10 Essential (primary) hypertension: Secondary | ICD-10-CM | POA: Diagnosis not present

## 2019-05-11 DIAGNOSIS — M47816 Spondylosis without myelopathy or radiculopathy, lumbar region: Secondary | ICD-10-CM | POA: Diagnosis not present

## 2019-05-11 DIAGNOSIS — Z6834 Body mass index (BMI) 34.0-34.9, adult: Secondary | ICD-10-CM | POA: Diagnosis not present

## 2019-05-11 DIAGNOSIS — M545 Low back pain: Secondary | ICD-10-CM | POA: Diagnosis not present

## 2019-05-15 ENCOUNTER — Other Ambulatory Visit (HOSPITAL_COMMUNITY): Payer: Self-pay | Admitting: Neurosurgery

## 2019-05-15 DIAGNOSIS — G8929 Other chronic pain: Secondary | ICD-10-CM

## 2019-05-16 ENCOUNTER — Telehealth: Payer: Self-pay | Admitting: Cardiology

## 2019-05-16 NOTE — Telephone Encounter (Signed)
[place] Ana contacting patient numerous time to schedule appointment from recall list. Deleted recall from system.

## 2019-05-17 DIAGNOSIS — E1165 Type 2 diabetes mellitus with hyperglycemia: Secondary | ICD-10-CM | POA: Diagnosis not present

## 2019-05-17 DIAGNOSIS — Z299 Encounter for prophylactic measures, unspecified: Secondary | ICD-10-CM | POA: Diagnosis not present

## 2019-05-17 DIAGNOSIS — Z6834 Body mass index (BMI) 34.0-34.9, adult: Secondary | ICD-10-CM | POA: Diagnosis not present

## 2019-05-17 DIAGNOSIS — R5383 Other fatigue: Secondary | ICD-10-CM | POA: Diagnosis not present

## 2019-05-17 DIAGNOSIS — R252 Cramp and spasm: Secondary | ICD-10-CM | POA: Diagnosis not present

## 2019-05-17 DIAGNOSIS — R413 Other amnesia: Secondary | ICD-10-CM | POA: Diagnosis not present

## 2019-05-17 DIAGNOSIS — I10 Essential (primary) hypertension: Secondary | ICD-10-CM | POA: Diagnosis not present

## 2019-05-21 DIAGNOSIS — I10 Essential (primary) hypertension: Secondary | ICD-10-CM | POA: Diagnosis not present

## 2019-05-30 DIAGNOSIS — Z6834 Body mass index (BMI) 34.0-34.9, adult: Secondary | ICD-10-CM | POA: Diagnosis not present

## 2019-05-30 DIAGNOSIS — J9611 Chronic respiratory failure with hypoxia: Secondary | ICD-10-CM | POA: Diagnosis not present

## 2019-05-30 DIAGNOSIS — M069 Rheumatoid arthritis, unspecified: Secondary | ICD-10-CM | POA: Diagnosis not present

## 2019-05-30 DIAGNOSIS — E1165 Type 2 diabetes mellitus with hyperglycemia: Secondary | ICD-10-CM | POA: Diagnosis not present

## 2019-05-30 DIAGNOSIS — J449 Chronic obstructive pulmonary disease, unspecified: Secondary | ICD-10-CM | POA: Diagnosis not present

## 2019-05-30 DIAGNOSIS — I10 Essential (primary) hypertension: Secondary | ICD-10-CM | POA: Diagnosis not present

## 2019-05-30 DIAGNOSIS — Z299 Encounter for prophylactic measures, unspecified: Secondary | ICD-10-CM | POA: Diagnosis not present

## 2019-06-11 ENCOUNTER — Ambulatory Visit (HOSPITAL_COMMUNITY)
Admission: RE | Admit: 2019-06-11 | Discharge: 2019-06-11 | Disposition: A | Discharge status: Home / Self Care | Payer: Medicare Other | Source: Ambulatory Visit | Attending: Neurosurgery | Admitting: Neurosurgery

## 2019-06-11 DIAGNOSIS — Z20828 Contact with and (suspected) exposure to other viral communicable diseases: Secondary | ICD-10-CM | POA: Diagnosis not present

## 2019-06-11 DIAGNOSIS — Z01812 Encounter for preprocedural laboratory examination: Secondary | ICD-10-CM | POA: Diagnosis not present

## 2019-06-11 LAB — SARS CORONAVIRUS 2 (TAT 6-24 HRS): SARS Coronavirus 2: NEGATIVE

## 2019-06-13 ENCOUNTER — Encounter (HOSPITAL_COMMUNITY): Payer: Self-pay | Admitting: *Deleted

## 2019-06-13 ENCOUNTER — Other Ambulatory Visit: Payer: Self-pay

## 2019-06-13 NOTE — Progress Notes (Signed)
Patient denies shortness of breath, fever, cough and chest pain.  PCP - Dr Jerene Bears Cardiologist - Dr Carlyle Dolly  Chest x-ray - Denies EKG - DOS 06/14/19 Stress Test - 03/23/16 ECHO - 03/08/16 Cardiac Cath - Denies  Fasting Blood Sugar - 130-150s Checks Blood Sugar 1 times a day  . Do not take oral diabetes medicines (Glipizide, Metformin) the morning of surgery.  . If your blood sugar is less than 70 mg/dL, you will need to treat for low blood sugar: o Do not take insulin. o Treat a low blood sugar (less than 70 mg/dL) with  cup of clear juice (cranberry or apple). o Recheck blood sugar in 15 minutes after treatment (to make sure it is greater than 70 mg/dL). If your blood sugar is not greater than 70 mg/dL on recheck, call [phone] for further instructions.  Aspirin Instructions: Follow your surgeon's instructions on when to stop aspirin prior to surgery,  If no instructions were given by your surgeon then you will need to call the office for those instructions.  Anesthesia review: Yes  STOP now taking any Aspirin (unless otherwise instructed by your surgeon), Aleve, Naproxen, Ibuprofen, Motrin, Advil, Goody's, BC's, all herbal medications, fish oil, and all vitamins.   Coronavirus Screening Covid test on 06/11/19 was negative.  Patient verbalized understanding of instructions that were given via phone.

## 2019-06-13 NOTE — Anesthesia Preprocedure Evaluation (Addendum)
Anesthesia Evaluation  Patient identified by MRN, date of birth, ID band Patient awake    Reviewed: Allergy & Precautions, NPO status , Patient's Chart, lab work & pertinent test results  Airway Mallampati: III  TM Distance: >3 FB Neck ROM: Full    Dental  (+) Dental Advisory Given, Poor Dentition, Edentulous Upper, Partial Lower   Pulmonary shortness of breath, asthma , pneumonia, COPD, former smoker,    breath sounds clear to auscultation + decreased breath sounds      Cardiovascular hypertension, Pt. on medications Normal cardiovascular exam Rhythm:Regular Rate:Normal     Neuro/Psych PSYCHIATRIC DISORDERS Anxiety negative neurological ROS     GI/Hepatic Neg liver ROS, GERD  ,  Endo/Other  diabetes, Type 2Hypothyroidism   Renal/GU negative Renal ROS     Musculoskeletal  (+) Arthritis , Fibromyalgia -  Abdominal   Peds  Hematology negative hematology ROS (+)   Anesthesia Other Findings   Reproductive/Obstetrics negative OB ROS                                                            Anesthesia Evaluation  Patient identified by MRN, date of birth, ID band Patient awake    Reviewed: Allergy & Precautions, NPO status , Patient's Chart, lab work & pertinent test results  Airway Mallampati: II   Neck ROM: full    Dental   Pulmonary asthma , COPD, Current Smoker,    breath sounds clear to auscultation       Cardiovascular hypertension,  Rhythm:regular Rate:Normal     Neuro/Psych Anxiety  Neuromuscular disease    GI/Hepatic GERD  ,  Endo/Other  Hypothyroidism   Renal/GU      Musculoskeletal  (+) Arthritis , Fibromyalgia -  Abdominal   Peds  Hematology   Anesthesia Other Findings   Reproductive/Obstetrics                             Anesthesia Physical Anesthesia Plan  ASA: II  Anesthesia Plan: General   Post-op Pain  Management:    Induction: Intravenous  Airway Management Planned: LMA  Additional Equipment:   Intra-op Plan:   Post-operative Plan:   Informed Consent: I have reviewed the patients History and Physical, chart, labs and discussed the procedure including the risks, benefits and alternatives for the proposed anesthesia with the patient or authorized representative who has indicated his/her understanding and acceptance.     Plan Discussed with: CRNA, Anesthesiologist and Surgeon  Anesthesia Plan Comments:         Anesthesia Quick Evaluation  Anesthesia Physical Anesthesia Plan  ASA: III  Anesthesia Plan: General   Post-op Pain Management:    Induction: Intravenous  PONV Risk Score and Plan: 3 and Ondansetron, Dexamethasone and Treatment may vary due to age or medical condition  Airway Management Planned: Oral ETT  Additional Equipment:   Intra-op Plan:   Post-operative Plan: Possible Post-op intubation/ventilation  Informed Consent: I have reviewed the patients History and Physical, chart, labs and discussed the procedure including the risks, benefits and alternatives for the proposed anesthesia with the patient or authorized representative who has indicated his/her understanding and acceptance.     Dental advisory given  Plan Discussed with: CRNA  Anesthesia Plan Comments: (Evaluated by cardiology  in 2017 for DOE/SOB. Per notes, during course of evaluation her symptoms resolved. She had a low risk stress test and echo with EF 50-55%. No further cardiac workup was recommended.   Will need DOS labs and anesthesia eval.   Nuclear stress 03/23/16:  No diagnostic ST segment changes to indicate ischemia.  Small, mild intensity, partially reversible mid anterior defect with fixed apical anteroseptal defect. This is suggestive of variable breast attenuation artifact, less likely a small region of ischemia.  This is a low risk study.  Nuclear stress EF:  70%.  TTE 03/10/2016: Conclusions: There is mild left ventricular hypertrophy with normal size LV.  Left ventricular ejection fraction is estimated at 50 to 55%.  E/A-wave reversal.  Trace mitral regurgitation.  Aortic valve is a trileaflet structure with mild aortic valve sclerosis.  There is mild tricuspid regurgitation.  RVSP 30 to 35 mmHg.  Trace pulmonic regurgitation. )       Anesthesia Quick Evaluation

## 2019-06-13 NOTE — Progress Notes (Signed)
Anesthesia Chart Review: Same day workup  Evaluated by cardiology in 2017 for DOE/SOB. Per notes, during course of evaluation her symptoms resolved. She had a low risk stress test and echo with EF 50-55%. No further cardiac workup was recommended.   Will need DOS labs and anesthesia eval.   Nuclear stress 03/23/16:  No diagnostic ST segment changes to indicate ischemia.  Small, mild intensity, partially reversible mid anterior defect with fixed apical anteroseptal defect. This is suggestive of variable breast attenuation artifact, less likely a small region of ischemia.  This is a low risk study.  Nuclear stress EF: 70%.  TTE 03/10/2016: Conclusions: There is mild left ventricular hypertrophy with normal size LV.  Left ventricular ejection fraction is estimated at 50 to 55%.  E/A-wave reversal.  Trace mitral regurgitation.  Aortic valve is a trileaflet structure with mild aortic valve sclerosis.  There is mild tricuspid regurgitation.  RVSP 30 to 35 mmHg.  Trace pulmonic regurgitation.   Wynonia Musty [facility] Short Stay Center/Anesthesiology Phone [phone] 06/13/2019 11:29 AM

## 2019-06-14 ENCOUNTER — Other Ambulatory Visit: Payer: Self-pay

## 2019-06-14 ENCOUNTER — Ambulatory Visit (HOSPITAL_COMMUNITY): Payer: Medicare Other | Admitting: Physician Assistant

## 2019-06-14 ENCOUNTER — Ambulatory Visit (HOSPITAL_COMMUNITY)
Admission: RE | Admit: 2019-06-14 | Discharge: 2019-06-14 | Disposition: A | Discharge status: Home / Self Care | Payer: Medicare Other | Source: Ambulatory Visit | Attending: Neurosurgery | Admitting: Neurosurgery

## 2019-06-14 ENCOUNTER — Encounter (HOSPITAL_COMMUNITY)
Admission: RE | Disposition: A | Discharge status: Home / Self Care | Payer: Self-pay | Source: Home / Self Care | Attending: Neurosurgery

## 2019-06-14 ENCOUNTER — Encounter (HOSPITAL_COMMUNITY): Payer: Self-pay

## 2019-06-14 ENCOUNTER — Ambulatory Visit (HOSPITAL_COMMUNITY)
Admission: RE | Admit: 2019-06-14 | Discharge: 2019-06-14 | Disposition: A | Discharge status: Home / Self Care | Payer: Medicare Other | Attending: Neurosurgery | Admitting: Neurosurgery

## 2019-06-14 DIAGNOSIS — Q7649 Other congenital malformations of spine, not associated with scoliosis: Secondary | ICD-10-CM | POA: Insufficient documentation

## 2019-06-14 DIAGNOSIS — M797 Fibromyalgia: Secondary | ICD-10-CM | POA: Insufficient documentation

## 2019-06-14 DIAGNOSIS — M48061 Spinal stenosis, lumbar region without neurogenic claudication: Secondary | ICD-10-CM | POA: Insufficient documentation

## 2019-06-14 DIAGNOSIS — M069 Rheumatoid arthritis, unspecified: Secondary | ICD-10-CM | POA: Diagnosis not present

## 2019-06-14 DIAGNOSIS — M329 Systemic lupus erythematosus, unspecified: Secondary | ICD-10-CM | POA: Insufficient documentation

## 2019-06-14 DIAGNOSIS — G8929 Other chronic pain: Secondary | ICD-10-CM

## 2019-06-14 DIAGNOSIS — Z87891 Personal history of nicotine dependence: Secondary | ICD-10-CM | POA: Insufficient documentation

## 2019-06-14 DIAGNOSIS — I10 Essential (primary) hypertension: Secondary | ICD-10-CM | POA: Diagnosis not present

## 2019-06-14 DIAGNOSIS — Z885 Allergy status to narcotic agent status: Secondary | ICD-10-CM | POA: Insufficient documentation

## 2019-06-14 DIAGNOSIS — E119 Type 2 diabetes mellitus without complications: Secondary | ICD-10-CM | POA: Diagnosis not present

## 2019-06-14 DIAGNOSIS — E039 Hypothyroidism, unspecified: Secondary | ICD-10-CM | POA: Diagnosis not present

## 2019-06-14 DIAGNOSIS — Z9981 Dependence on supplemental oxygen: Secondary | ICD-10-CM | POA: Diagnosis not present

## 2019-06-14 DIAGNOSIS — J449 Chronic obstructive pulmonary disease, unspecified: Secondary | ICD-10-CM | POA: Insufficient documentation

## 2019-06-14 DIAGNOSIS — M5126 Other intervertebral disc displacement, lumbar region: Secondary | ICD-10-CM | POA: Insufficient documentation

## 2019-06-14 DIAGNOSIS — I119 Hypertensive heart disease without heart failure: Secondary | ICD-10-CM | POA: Insufficient documentation

## 2019-06-14 DIAGNOSIS — M545 Low back pain: Secondary | ICD-10-CM | POA: Diagnosis not present

## 2019-06-14 HISTORY — DX: Dependence on supplemental oxygen: Z99.81

## 2019-06-14 HISTORY — DX: Hyperlipidemia, unspecified: E78.5

## 2019-06-14 HISTORY — DX: Type 2 diabetes mellitus without complications: E11.9

## 2019-06-14 HISTORY — DX: Dyspnea, unspecified: R06.00

## 2019-06-14 HISTORY — PX: RADIOLOGY WITH ANESTHESIA: SHX6223

## 2019-06-14 LAB — BASIC METABOLIC PANEL
Anion gap: 9 (ref 5–15)
BUN: 20 mg/dL (ref 8–23)
CO2: 22 mmol/L (ref 22–32)
Calcium: 9.2 mg/dL (ref 8.9–10.3)
Chloride: 105 mmol/L (ref 98–111)
Creatinine, Ser: 1.02 mg/dL — ABNORMAL HIGH (ref 0.44–1.00)
GFR calc Af Amer: >60 mL/min (ref >60)
GFR calc non Af Amer: 52 mL/min — ABNORMAL LOW (ref >60)
Glucose, Bld: 127 mg/dL — ABNORMAL HIGH (ref 70–99)
Potassium: 4.3 mmol/L (ref 3.5–5.1)
Sodium: 136 mmol/L (ref 135–145)

## 2019-06-14 LAB — GLUCOSE, CAPILLARY
Glucose-Capillary: 119 mg/dL — ABNORMAL HIGH (ref 70–99)
Glucose-Capillary: 114 mg/dL — ABNORMAL HIGH (ref 70–99)
Glucose-Capillary: 109 mg/dL — ABNORMAL HIGH (ref 70–99)

## 2019-06-14 SURGERY — MRI WITH ANESTHESIA
Anesthesia: General

## 2019-06-14 MED ORDER — LACTATED RINGERS IV SOLN
INTRAVENOUS | Status: DC | PRN
  Administered 2019-06-14: 11:00:00 via INTRAVENOUS

## 2019-06-14 MED ORDER — LACTATED RINGERS IV SOLN
INTRAVENOUS | Status: DC
  Administered 2019-06-14: 11:00:00 via INTRAVENOUS

## 2019-06-14 MED ORDER — GADOBUTROL 1 MMOL/ML IV SOLN
7.0000 mL | Freq: Once | INTRAVENOUS | Status: AC | PRN
  Administered 2019-06-14: 7 mL via INTRAVENOUS

## 2019-06-14 NOTE — Progress Notes (Signed)
MRI form faxed to Radiology.

## 2019-06-14 NOTE — Transfer of Care (Signed)
Immediate Anesthesia Transfer of Care Note  Patient: Jean Taylor  Procedure(s) Performed: MRI WITH ANESTHESIA LUMBAR SPINE WITHOUT AND WITH CONTRAST (N/A )  Patient Location: PACU  Anesthesia Type:General  Level of Consciousness: awake, alert  and oriented  Airway & Oxygen Therapy: Patient Spontanous Breathing and Patient connected to nasal cannula oxygen  Post-op Assessment: Report given to RN, Post -op Vital signs reviewed and stable and Patient moving all extremities X 4  Post vital signs: Reviewed and stable  Last Vitals:  Vitals Value Taken Time  BP 138/80 06/14/19 1452  Temp    Pulse 64 06/14/19 1454  Resp 17 06/14/19 1454  SpO2 97 % 06/14/19 1454  Vitals shown include unvalidated device data.  Last Pain:  Vitals:   06/14/19 1035  TempSrc:   PainSc: 0-No pain         Complications: No apparent anesthesia complications

## 2019-06-15 ENCOUNTER — Encounter (HOSPITAL_COMMUNITY): Payer: Self-pay | Admitting: Radiology

## 2019-06-15 DIAGNOSIS — Z23 Encounter for immunization: Secondary | ICD-10-CM | POA: Diagnosis not present

## 2019-06-15 MED FILL — Sugammadex Sodium IV 200 MG/2ML (Base Equivalent): INTRAVENOUS | Qty: 4 | Status: AC

## 2019-06-15 MED FILL — Rocuronium Bromide IV Soln 100 MG/10ML (10 MG/ML): INTRAVENOUS | Qty: 10 | Status: AC

## 2019-06-15 MED FILL — Lidocaine HCl Local Soln Prefilled Syringe 100 MG/5ML (2%): INTRAMUSCULAR | Qty: 5 | Status: AC

## 2019-06-15 MED FILL — Propofol IV Emul 200 MG/20ML (10 MG/ML): INTRAVENOUS | Qty: 20 | Status: AC

## 2019-06-15 MED FILL — Dexamethasone Sodium Phosphate Inj 10 MG/ML: INTRAMUSCULAR | Qty: 0.5 | Status: AC

## 2019-06-15 NOTE — Anesthesia Postprocedure Evaluation (Signed)
Anesthesia Post Note  Patient: Jean Taylor  Procedure(s) Performed: MRI WITH ANESTHESIA LUMBAR SPINE WITHOUT AND WITH CONTRAST (N/A )     Patient location during evaluation: PACU Anesthesia Type: General Level of consciousness: sedated and patient cooperative Pain management: pain level controlled Vital Signs Assessment: post-procedure vital signs reviewed and stable Respiratory status: spontaneous breathing Cardiovascular status: stable Anesthetic complications: no    Last Vitals:  Vitals:   06/14/19 1511 06/14/19 1514  BP: 132/64 132/62  Pulse: 65 61  Resp: 19 17  Temp:    SpO2: 97% 95%    Last Pain:  Vitals:   06/14/19 1511  TempSrc:   PainSc: 0-No pain                 Nolon Nations

## 2019-06-15 NOTE — H&P (Signed)
Subjective: Patient is a 78 y.o. female who is admitted for MRI under general anesthesia.  She has been having chronic pain in the low back.  She requires an MRI under anesthesia.   Past Medical History:  Diagnosis Date  . Anxiety   . Arthritis   . Asthma   . Blood transfusion without reported diagnosis 1960   miscarriage - need to be transfused  . Breast calcification, right 02/28/2013   Excised 03/08/13:B9 on path   . COPD (chronic obstructive pulmonary disease) ([place])   . Diabetes mellitus without complication ([place])    type 2  . Dyspnea    on oxygen at night 2 L via [place]  . Fibromyalgia   . GERD (gastroesophageal reflux disease)   . Hyperlipidemia   . Hypertension   . Hypothyroidism   . IBS (irritable bowel syndrome)   . Lupus ([place])   . Lupus ([place])   . Neuromuscular disorder ([place])   . On home oxygen therapy    only at night 2L via [place]  . Pneumonia 08/2018  . Recurrent upper respiratory infection (URI)   . Rheumatoid arthritis ([place])   . RLS (restless legs syndrome)   . Thyroid disease   . Wears dentures    top-lower partial    Past Surgical History:  Procedure Laterality Date  . ABDOMINAL HYSTERECTOMY    . BREAST BIOPSY Right 03/08/2013   Procedure:  NEEDLE LOCALIZATION REMOVAL RIGHT BREAST CALCIFICATIONS;  Surgeon: Haywood Lasso, MD;  Location: [facility];  Service: General;  Laterality: Right;  . CHOLECYSTECTOMY    . COLONOSCOPY    . EYE SURGERY Bilateral    cataract surgery with lens implant  . KNEE ARTHROSCOPY Left   . MINIMALLY INVASIVE MICRODISCECTOMY LUMBAR SPINE    . MULTIPLE TOOTH EXTRACTIONS    . NASAL SEPTUM SURGERY    . RADIOLOGY WITH ANESTHESIA N/A 07/10/2015   Procedure: MRI;  Surgeon: Medication Radiologist, MD;  Location: [facility];  Service: Radiology;  Laterality: N/A;  . RADIOLOGY WITH ANESTHESIA N/A 06/14/2019   Procedure: MRI WITH ANESTHESIA LUMBAR SPINE WITHOUT AND WITH CONTRAST;  Surgeon: Radiologist, Medication, MD;  Location: [facility];  Service: Radiology;  Laterality: N/A;  . UPPER GI ENDOSCOPY      No medications prior to admission.   Allergies  Allergen Reactions  . Demerol [Meperidine]     Hallucinations  . Morphine And Related Nausea And Vomiting    Social History   Tobacco Use  . Smoking status: Former Smoker    Packs/day: 0.50    Years: 50.00    Pack years: 25.00    Types: Cigarettes    Start date: 12/08/1960    Quit date: 11/19/2017    Years since quitting: 1.5  . Smokeless tobacco: Never Used  Substance Use Topics  . Alcohol use: No    Family History  Problem Relation Age of Onset  . Cancer Mother        breast  . Allergic rhinitis Neg Hx   . Asthma Neg Hx   . Eczema Neg Hx   . Immunodeficiency Neg Hx      Review of Systems Notable for those difficulties described in her history of present illness and Past Medical history, but is otherwise unremarkable.  Objective: Vital signs in last 24 hours: Temp:  [98.3 F (36.8 C)] 98.3 F (36.8 C) (10/22 1452) Pulse Rate:  [61-65] 61 (10/22 1514) Resp:  [16-19] 17 (10/22 1514) BP: (132)/(62-64) 132/62 (10/22 1514)  SpO2:  [94 %-97 %] 95 % (10/22 1514)  EXAM:   The patient is a well-developed well-nourished white female in no acute distress.   Lungs are clear to auscultation , the patient has symmetrical respiratory excursion. Heart has a regular rate and rhythm normal S1 and S2 no murmur.   Abdomen is soft nontender nondistended bowel sounds are present. Extremity examination shows no clubbing cyanosis or edema.or examination shows 5/5 strength through the lower extremities including the iliopsoas, quadriceps, dorsiflexor, EHL, and plantar flexor bilaterally.  Sensation is decreased to pinprick in the distal lower extremities.  Reflexes are 1 at the quadriceps bilaterally, left gastrocnemius is minimal, right gastrocnemius is absent.  Toes are downgoing bilaterally.   Data Review:CBC    Component Value Date/Time   WBC 6.0 07/10/2015 0931   RBC  4.85 07/10/2015 0931   HGB 13.6 07/10/2015 0931   HCT 41.7 07/10/2015 0931   PLT 205 07/10/2015 0931   MCV 86.0 07/10/2015 0931   MCH 28.0 07/10/2015 0931   MCHC 32.6 07/10/2015 0931   RDW 14.2 07/10/2015 0931                          BMET    Component Value Date/Time   NA 136 06/14/2019 0957   K 4.3 06/14/2019 0957   CL 105 06/14/2019 0957   CO2 22 06/14/2019 0957   GLUCOSE 127 (H) 06/14/2019 0957   BUN 20 06/14/2019 0957   CREATININE 1.02 (H) 06/14/2019 0957   CALCIUM 9.2 06/14/2019 0957   GFRNONAA 52 (L) 06/14/2019 0957   GFRAA >60 06/14/2019 0957     Assessment/Plan: Patient admitted for MRI under general anesthesia.   Hosie Spangle, MD 06/15/2019 10:19 AM

## 2019-06-18 ENCOUNTER — Telehealth: Payer: Self-pay | Admitting: General Practice

## 2019-06-18 NOTE — Telephone Encounter (Signed)
Gave patient negative covid test results Patient understood 

## 2019-06-19 DIAGNOSIS — I10 Essential (primary) hypertension: Secondary | ICD-10-CM | POA: Diagnosis not present

## 2019-06-27 DIAGNOSIS — M545 Low back pain: Secondary | ICD-10-CM | POA: Diagnosis not present

## 2019-06-27 DIAGNOSIS — M47816 Spondylosis without myelopathy or radiculopathy, lumbar region: Secondary | ICD-10-CM | POA: Diagnosis not present

## 2019-06-27 DIAGNOSIS — M5136 Other intervertebral disc degeneration, lumbar region: Secondary | ICD-10-CM | POA: Diagnosis not present

## 2019-07-12 DIAGNOSIS — Z6835 Body mass index (BMI) 35.0-35.9, adult: Secondary | ICD-10-CM | POA: Diagnosis not present

## 2019-07-12 DIAGNOSIS — J449 Chronic obstructive pulmonary disease, unspecified: Secondary | ICD-10-CM | POA: Diagnosis not present

## 2019-07-12 DIAGNOSIS — M069 Rheumatoid arthritis, unspecified: Secondary | ICD-10-CM | POA: Diagnosis not present

## 2019-07-12 DIAGNOSIS — M3500 Sicca syndrome, unspecified: Secondary | ICD-10-CM | POA: Diagnosis not present

## 2019-07-12 DIAGNOSIS — E1165 Type 2 diabetes mellitus with hyperglycemia: Secondary | ICD-10-CM | POA: Diagnosis not present

## 2019-07-12 DIAGNOSIS — Z299 Encounter for prophylactic measures, unspecified: Secondary | ICD-10-CM | POA: Diagnosis not present

## 2019-07-12 DIAGNOSIS — I10 Essential (primary) hypertension: Secondary | ICD-10-CM | POA: Diagnosis not present

## 2019-07-31 DIAGNOSIS — M545 Low back pain: Secondary | ICD-10-CM | POA: Diagnosis not present

## 2019-07-31 DIAGNOSIS — Z6835 Body mass index (BMI) 35.0-35.9, adult: Secondary | ICD-10-CM | POA: Diagnosis not present

## 2019-07-31 DIAGNOSIS — M48061 Spinal stenosis, lumbar region without neurogenic claudication: Secondary | ICD-10-CM | POA: Diagnosis not present

## 2019-07-31 DIAGNOSIS — I10 Essential (primary) hypertension: Secondary | ICD-10-CM | POA: Diagnosis not present

## 2019-07-31 DIAGNOSIS — M5136 Other intervertebral disc degeneration, lumbar region: Secondary | ICD-10-CM | POA: Diagnosis not present

## 2019-07-31 DIAGNOSIS — M47816 Spondylosis without myelopathy or radiculopathy, lumbar region: Secondary | ICD-10-CM | POA: Diagnosis not present

## 2019-08-02 DIAGNOSIS — M5136 Other intervertebral disc degeneration, lumbar region: Secondary | ICD-10-CM | POA: Diagnosis not present

## 2019-08-02 DIAGNOSIS — M4726 Other spondylosis with radiculopathy, lumbar region: Secondary | ICD-10-CM | POA: Diagnosis not present

## 2019-08-02 DIAGNOSIS — M48061 Spinal stenosis, lumbar region without neurogenic claudication: Secondary | ICD-10-CM | POA: Diagnosis not present

## 2019-08-15 DIAGNOSIS — I10 Essential (primary) hypertension: Secondary | ICD-10-CM | POA: Diagnosis not present

## 2019-09-17 DIAGNOSIS — I10 Essential (primary) hypertension: Secondary | ICD-10-CM | POA: Diagnosis not present

## 2019-10-21 DIAGNOSIS — I10 Essential (primary) hypertension: Secondary | ICD-10-CM | POA: Diagnosis not present

## 2019-10-25 DIAGNOSIS — E1165 Type 2 diabetes mellitus with hyperglycemia: Secondary | ICD-10-CM | POA: Diagnosis not present

## 2019-10-25 DIAGNOSIS — L97909 Non-pressure chronic ulcer of unspecified part of unspecified lower leg with unspecified severity: Secondary | ICD-10-CM | POA: Diagnosis not present

## 2019-10-25 DIAGNOSIS — D692 Other nonthrombocytopenic purpura: Secondary | ICD-10-CM | POA: Diagnosis not present

## 2019-10-25 DIAGNOSIS — J9611 Chronic respiratory failure with hypoxia: Secondary | ICD-10-CM | POA: Diagnosis not present

## 2019-10-25 DIAGNOSIS — Z6835 Body mass index (BMI) 35.0-35.9, adult: Secondary | ICD-10-CM | POA: Diagnosis not present

## 2019-10-25 DIAGNOSIS — I10 Essential (primary) hypertension: Secondary | ICD-10-CM | POA: Diagnosis not present

## 2019-10-25 DIAGNOSIS — Z299 Encounter for prophylactic measures, unspecified: Secondary | ICD-10-CM | POA: Diagnosis not present

## 2019-10-31 DIAGNOSIS — L97909 Non-pressure chronic ulcer of unspecified part of unspecified lower leg with unspecified severity: Secondary | ICD-10-CM | POA: Diagnosis not present

## 2019-10-31 DIAGNOSIS — M3500 Sicca syndrome, unspecified: Secondary | ICD-10-CM | POA: Diagnosis not present

## 2019-10-31 DIAGNOSIS — J449 Chronic obstructive pulmonary disease, unspecified: Secondary | ICD-10-CM | POA: Diagnosis not present

## 2019-10-31 DIAGNOSIS — Z299 Encounter for prophylactic measures, unspecified: Secondary | ICD-10-CM | POA: Diagnosis not present

## 2019-10-31 DIAGNOSIS — E1165 Type 2 diabetes mellitus with hyperglycemia: Secondary | ICD-10-CM | POA: Diagnosis not present

## 2019-10-31 DIAGNOSIS — I10 Essential (primary) hypertension: Secondary | ICD-10-CM | POA: Diagnosis not present

## 2019-11-02 DIAGNOSIS — R609 Edema, unspecified: Secondary | ICD-10-CM | POA: Diagnosis not present

## 2019-11-02 DIAGNOSIS — M3500 Sicca syndrome, unspecified: Secondary | ICD-10-CM | POA: Diagnosis not present

## 2019-11-02 DIAGNOSIS — I10 Essential (primary) hypertension: Secondary | ICD-10-CM | POA: Diagnosis not present

## 2019-11-02 DIAGNOSIS — J449 Chronic obstructive pulmonary disease, unspecified: Secondary | ICD-10-CM | POA: Diagnosis not present

## 2019-11-02 DIAGNOSIS — E1165 Type 2 diabetes mellitus with hyperglycemia: Secondary | ICD-10-CM | POA: Diagnosis not present

## 2019-11-02 DIAGNOSIS — Z299 Encounter for prophylactic measures, unspecified: Secondary | ICD-10-CM | POA: Diagnosis not present

## 2019-11-12 DIAGNOSIS — E1151 Type 2 diabetes mellitus with diabetic peripheral angiopathy without gangrene: Secondary | ICD-10-CM | POA: Diagnosis not present

## 2019-11-12 DIAGNOSIS — M79672 Pain in left foot: Secondary | ICD-10-CM | POA: Diagnosis not present

## 2019-11-12 DIAGNOSIS — M79671 Pain in right foot: Secondary | ICD-10-CM | POA: Diagnosis not present

## 2019-11-12 DIAGNOSIS — E1140 Type 2 diabetes mellitus with diabetic neuropathy, unspecified: Secondary | ICD-10-CM | POA: Diagnosis not present

## 2019-11-12 DIAGNOSIS — I739 Peripheral vascular disease, unspecified: Secondary | ICD-10-CM | POA: Diagnosis not present

## 2019-11-13 ENCOUNTER — Other Ambulatory Visit: Payer: Self-pay

## 2019-11-13 DIAGNOSIS — I712 Thoracic aortic aneurysm, without rupture, unspecified: Secondary | ICD-10-CM

## 2019-11-13 DIAGNOSIS — I713 Abdominal aortic aneurysm, ruptured, unspecified: Secondary | ICD-10-CM

## 2019-11-14 DIAGNOSIS — Z23 Encounter for immunization: Secondary | ICD-10-CM | POA: Diagnosis not present

## 2019-11-16 ENCOUNTER — Other Ambulatory Visit: Payer: Self-pay

## 2019-11-16 MED ORDER — LORAZEPAM 1 MG PO TABS: 1.0000 mg | ORAL_TABLET | Freq: Once | ORAL | 0 refills | Status: DC

## 2019-11-20 DIAGNOSIS — I10 Essential (primary) hypertension: Secondary | ICD-10-CM | POA: Diagnosis not present

## 2019-11-21 ENCOUNTER — Telehealth: Payer: Self-pay | Admitting: Vascular Surgery

## 2019-11-21 MED ORDER — DIAZEPAM 10 MG PO TABS: 10.0000 mg | ORAL_TABLET | Freq: Once | ORAL | 0 refills | Status: AC

## 2019-11-21 NOTE — Telephone Encounter (Signed)
Spoke to patient today regarding her upcoming CT scan of the chest.  She is very claustrophobic.  She is very resistant to having the CT scan performed due to her anxiety level.  I discussed with her that we could give her a dose of Valium the morning of her procedure.  She seemed amenable to this.  We will reschedule her CT angio of the chest in the near future.  I will prescribe for her Valium 10 mg p.o. once as a preprocedure dose.  I discussed with her the side effects of the Valium as far as sleepiness and certainly she will need a ride home and will not be able to drive.  She has taken Valium 5 mg in the past without difficulty.  Ruta Hinds, MD [facility] Office: [phone]

## 2019-11-22 ENCOUNTER — Encounter: Payer: Medicare Other | Admitting: Vascular Surgery

## 2019-11-27 DIAGNOSIS — I10 Essential (primary) hypertension: Secondary | ICD-10-CM | POA: Diagnosis not present

## 2019-11-27 DIAGNOSIS — M069 Rheumatoid arthritis, unspecified: Secondary | ICD-10-CM | POA: Diagnosis not present

## 2019-11-27 DIAGNOSIS — E1165 Type 2 diabetes mellitus with hyperglycemia: Secondary | ICD-10-CM | POA: Diagnosis not present

## 2019-11-27 DIAGNOSIS — R600 Localized edema: Secondary | ICD-10-CM | POA: Diagnosis not present

## 2019-11-27 DIAGNOSIS — J449 Chronic obstructive pulmonary disease, unspecified: Secondary | ICD-10-CM | POA: Diagnosis not present

## 2019-11-27 DIAGNOSIS — Z299 Encounter for prophylactic measures, unspecified: Secondary | ICD-10-CM | POA: Diagnosis not present

## 2019-12-04 DIAGNOSIS — Z7982 Long term (current) use of aspirin: Secondary | ICD-10-CM | POA: Diagnosis not present

## 2019-12-04 DIAGNOSIS — L97929 Non-pressure chronic ulcer of unspecified part of left lower leg with unspecified severity: Secondary | ICD-10-CM | POA: Diagnosis not present

## 2019-12-04 DIAGNOSIS — L97919 Non-pressure chronic ulcer of unspecified part of right lower leg with unspecified severity: Secondary | ICD-10-CM | POA: Diagnosis not present

## 2019-12-04 DIAGNOSIS — Z9049 Acquired absence of other specified parts of digestive tract: Secondary | ICD-10-CM | POA: Diagnosis not present

## 2019-12-04 DIAGNOSIS — Z7984 Long term (current) use of oral hypoglycemic drugs: Secondary | ICD-10-CM | POA: Diagnosis not present

## 2019-12-04 DIAGNOSIS — R2243 Localized swelling, mass and lump, lower limb, bilateral: Secondary | ICD-10-CM | POA: Diagnosis not present

## 2019-12-04 DIAGNOSIS — I739 Peripheral vascular disease, unspecified: Secondary | ICD-10-CM | POA: Diagnosis not present

## 2019-12-04 DIAGNOSIS — L97928 Non-pressure chronic ulcer of unspecified part of left lower leg with other specified severity: Secondary | ICD-10-CM | POA: Diagnosis not present

## 2019-12-04 DIAGNOSIS — Z79899 Other long term (current) drug therapy: Secondary | ICD-10-CM | POA: Diagnosis not present

## 2019-12-04 DIAGNOSIS — Z882 Allergy status to sulfonamides status: Secondary | ICD-10-CM | POA: Diagnosis not present

## 2019-12-04 DIAGNOSIS — L97918 Non-pressure chronic ulcer of unspecified part of right lower leg with other specified severity: Secondary | ICD-10-CM | POA: Diagnosis not present

## 2019-12-04 DIAGNOSIS — Z90710 Acquired absence of both cervix and uterus: Secondary | ICD-10-CM | POA: Diagnosis not present

## 2019-12-04 DIAGNOSIS — Z885 Allergy status to narcotic agent status: Secondary | ICD-10-CM | POA: Diagnosis not present

## 2019-12-07 ENCOUNTER — Telehealth: Payer: Self-pay

## 2019-12-07 ENCOUNTER — Ambulatory Visit
Admission: RE | Admit: 2019-12-07 | Discharge: 2019-12-07 | Disposition: A | Discharge status: Home / Self Care | Payer: Medicare Other | Source: Ambulatory Visit | Attending: Vascular Surgery | Admitting: Vascular Surgery

## 2019-12-07 DIAGNOSIS — I713 Abdominal aortic aneurysm, ruptured, unspecified: Secondary | ICD-10-CM

## 2019-12-07 DIAGNOSIS — I712 Thoracic aortic aneurysm, without rupture, unspecified: Secondary | ICD-10-CM

## 2019-12-07 NOTE — Telephone Encounter (Signed)
Spoke to [place] at [facility] regarding pt being unable to have CT scan today due to her elevated creatinine. Dr. Vernard Gambles recommended MRI. I have sent Dr. Oneida Alar a staff message regarding this.

## 2019-12-11 ENCOUNTER — Other Ambulatory Visit: Payer: Self-pay

## 2019-12-11 DIAGNOSIS — I712 Thoracic aortic aneurysm, without rupture, unspecified: Secondary | ICD-10-CM

## 2019-12-11 DIAGNOSIS — I713 Abdominal aortic aneurysm, ruptured, unspecified: Secondary | ICD-10-CM

## 2019-12-12 DIAGNOSIS — Z23 Encounter for immunization: Secondary | ICD-10-CM | POA: Diagnosis not present

## 2019-12-13 DIAGNOSIS — Z299 Encounter for prophylactic measures, unspecified: Secondary | ICD-10-CM | POA: Diagnosis not present

## 2019-12-13 DIAGNOSIS — K2970 Gastritis, unspecified, without bleeding: Secondary | ICD-10-CM | POA: Diagnosis not present

## 2019-12-13 DIAGNOSIS — L97928 Non-pressure chronic ulcer of unspecified part of left lower leg with other specified severity: Secondary | ICD-10-CM | POA: Diagnosis not present

## 2019-12-13 DIAGNOSIS — E1165 Type 2 diabetes mellitus with hyperglycemia: Secondary | ICD-10-CM | POA: Diagnosis not present

## 2019-12-13 DIAGNOSIS — R600 Localized edema: Secondary | ICD-10-CM | POA: Diagnosis not present

## 2019-12-13 DIAGNOSIS — M79605 Pain in left leg: Secondary | ICD-10-CM | POA: Diagnosis not present

## 2019-12-13 DIAGNOSIS — M79604 Pain in right leg: Secondary | ICD-10-CM | POA: Diagnosis not present

## 2019-12-13 DIAGNOSIS — L97918 Non-pressure chronic ulcer of unspecified part of right lower leg with other specified severity: Secondary | ICD-10-CM | POA: Diagnosis not present

## 2019-12-13 DIAGNOSIS — R609 Edema, unspecified: Secondary | ICD-10-CM | POA: Diagnosis not present

## 2019-12-13 DIAGNOSIS — M7989 Other specified soft tissue disorders: Secondary | ICD-10-CM | POA: Diagnosis not present

## 2019-12-13 DIAGNOSIS — Z6835 Body mass index (BMI) 35.0-35.9, adult: Secondary | ICD-10-CM | POA: Diagnosis not present

## 2019-12-13 DIAGNOSIS — J449 Chronic obstructive pulmonary disease, unspecified: Secondary | ICD-10-CM | POA: Diagnosis not present

## 2019-12-13 DIAGNOSIS — R197 Diarrhea, unspecified: Secondary | ICD-10-CM | POA: Diagnosis not present

## 2019-12-17 ENCOUNTER — Other Ambulatory Visit: Payer: Medicare Other

## 2019-12-19 ENCOUNTER — Other Ambulatory Visit: Payer: Medicare Other

## 2019-12-20 ENCOUNTER — Encounter: Payer: Medicare Other | Admitting: Vascular Surgery

## 2019-12-20 DIAGNOSIS — G4700 Insomnia, unspecified: Secondary | ICD-10-CM | POA: Diagnosis not present

## 2019-12-20 DIAGNOSIS — I10 Essential (primary) hypertension: Secondary | ICD-10-CM | POA: Diagnosis not present

## 2019-12-20 DIAGNOSIS — Z79899 Other long term (current) drug therapy: Secondary | ICD-10-CM | POA: Diagnosis not present

## 2019-12-20 DIAGNOSIS — Z9981 Dependence on supplemental oxygen: Secondary | ICD-10-CM | POA: Diagnosis not present

## 2019-12-20 DIAGNOSIS — M1990 Unspecified osteoarthritis, unspecified site: Secondary | ICD-10-CM | POA: Diagnosis not present

## 2019-12-20 DIAGNOSIS — Z4800 Encounter for change or removal of nonsurgical wound dressing: Secondary | ICD-10-CM | POA: Diagnosis not present

## 2019-12-20 DIAGNOSIS — Z7952 Long term (current) use of systemic steroids: Secondary | ICD-10-CM | POA: Diagnosis not present

## 2019-12-20 DIAGNOSIS — M069 Rheumatoid arthritis, unspecified: Secondary | ICD-10-CM | POA: Diagnosis not present

## 2019-12-20 DIAGNOSIS — M3500 Sicca syndrome, unspecified: Secondary | ICD-10-CM | POA: Diagnosis not present

## 2019-12-20 DIAGNOSIS — Z87891 Personal history of nicotine dependence: Secondary | ICD-10-CM | POA: Diagnosis not present

## 2019-12-20 DIAGNOSIS — Z794 Long term (current) use of insulin: Secondary | ICD-10-CM | POA: Diagnosis not present

## 2019-12-20 DIAGNOSIS — E039 Hypothyroidism, unspecified: Secondary | ICD-10-CM | POA: Diagnosis not present

## 2019-12-20 DIAGNOSIS — L97911 Non-pressure chronic ulcer of unspecified part of right lower leg limited to breakdown of skin: Secondary | ICD-10-CM | POA: Diagnosis not present

## 2019-12-20 DIAGNOSIS — J449 Chronic obstructive pulmonary disease, unspecified: Secondary | ICD-10-CM | POA: Diagnosis not present

## 2019-12-20 DIAGNOSIS — E11622 Type 2 diabetes mellitus with other skin ulcer: Secondary | ICD-10-CM | POA: Diagnosis not present

## 2019-12-21 DIAGNOSIS — I10 Essential (primary) hypertension: Secondary | ICD-10-CM | POA: Diagnosis not present

## 2019-12-25 DIAGNOSIS — M3500 Sicca syndrome, unspecified: Secondary | ICD-10-CM | POA: Diagnosis not present

## 2019-12-25 DIAGNOSIS — E039 Hypothyroidism, unspecified: Secondary | ICD-10-CM | POA: Diagnosis not present

## 2019-12-25 DIAGNOSIS — Z4800 Encounter for change or removal of nonsurgical wound dressing: Secondary | ICD-10-CM | POA: Diagnosis not present

## 2019-12-25 DIAGNOSIS — E11622 Type 2 diabetes mellitus with other skin ulcer: Secondary | ICD-10-CM | POA: Diagnosis not present

## 2019-12-25 DIAGNOSIS — L97911 Non-pressure chronic ulcer of unspecified part of right lower leg limited to breakdown of skin: Secondary | ICD-10-CM | POA: Diagnosis not present

## 2019-12-25 DIAGNOSIS — I10 Essential (primary) hypertension: Secondary | ICD-10-CM | POA: Diagnosis not present

## 2019-12-26 DIAGNOSIS — E1165 Type 2 diabetes mellitus with hyperglycemia: Secondary | ICD-10-CM | POA: Diagnosis not present

## 2019-12-26 DIAGNOSIS — Z299 Encounter for prophylactic measures, unspecified: Secondary | ICD-10-CM | POA: Diagnosis not present

## 2019-12-26 DIAGNOSIS — M3500 Sicca syndrome, unspecified: Secondary | ICD-10-CM | POA: Diagnosis not present

## 2019-12-26 DIAGNOSIS — J209 Acute bronchitis, unspecified: Secondary | ICD-10-CM | POA: Diagnosis not present

## 2019-12-26 DIAGNOSIS — J440 Chronic obstructive pulmonary disease with acute lower respiratory infection: Secondary | ICD-10-CM | POA: Diagnosis not present

## 2019-12-26 DIAGNOSIS — Z87891 Personal history of nicotine dependence: Secondary | ICD-10-CM | POA: Diagnosis not present

## 2019-12-31 DIAGNOSIS — M3500 Sicca syndrome, unspecified: Secondary | ICD-10-CM | POA: Diagnosis not present

## 2019-12-31 DIAGNOSIS — E11622 Type 2 diabetes mellitus with other skin ulcer: Secondary | ICD-10-CM | POA: Diagnosis not present

## 2019-12-31 DIAGNOSIS — E039 Hypothyroidism, unspecified: Secondary | ICD-10-CM | POA: Diagnosis not present

## 2019-12-31 DIAGNOSIS — L97911 Non-pressure chronic ulcer of unspecified part of right lower leg limited to breakdown of skin: Secondary | ICD-10-CM | POA: Diagnosis not present

## 2019-12-31 DIAGNOSIS — I10 Essential (primary) hypertension: Secondary | ICD-10-CM | POA: Diagnosis not present

## 2019-12-31 DIAGNOSIS — Z4800 Encounter for change or removal of nonsurgical wound dressing: Secondary | ICD-10-CM | POA: Diagnosis not present

## 2020-01-03 DIAGNOSIS — I739 Peripheral vascular disease, unspecified: Secondary | ICD-10-CM | POA: Diagnosis not present

## 2020-01-03 DIAGNOSIS — M79604 Pain in right leg: Secondary | ICD-10-CM | POA: Diagnosis not present

## 2020-01-03 DIAGNOSIS — R609 Edema, unspecified: Secondary | ICD-10-CM | POA: Diagnosis not present

## 2020-01-03 DIAGNOSIS — L97818 Non-pressure chronic ulcer of other part of right lower leg with other specified severity: Secondary | ICD-10-CM | POA: Diagnosis not present

## 2020-01-03 DIAGNOSIS — M79605 Pain in left leg: Secondary | ICD-10-CM | POA: Diagnosis not present

## 2020-01-03 DIAGNOSIS — M7989 Other specified soft tissue disorders: Secondary | ICD-10-CM | POA: Diagnosis not present

## 2020-01-03 DIAGNOSIS — L97828 Non-pressure chronic ulcer of other part of left lower leg with other specified severity: Secondary | ICD-10-CM | POA: Diagnosis not present

## 2020-01-09 DIAGNOSIS — I10 Essential (primary) hypertension: Secondary | ICD-10-CM | POA: Diagnosis not present

## 2020-01-09 DIAGNOSIS — E039 Hypothyroidism, unspecified: Secondary | ICD-10-CM | POA: Diagnosis not present

## 2020-01-09 DIAGNOSIS — E11622 Type 2 diabetes mellitus with other skin ulcer: Secondary | ICD-10-CM | POA: Diagnosis not present

## 2020-01-09 DIAGNOSIS — M3500 Sicca syndrome, unspecified: Secondary | ICD-10-CM | POA: Diagnosis not present

## 2020-01-09 DIAGNOSIS — L97911 Non-pressure chronic ulcer of unspecified part of right lower leg limited to breakdown of skin: Secondary | ICD-10-CM | POA: Diagnosis not present

## 2020-01-09 DIAGNOSIS — Z4800 Encounter for change or removal of nonsurgical wound dressing: Secondary | ICD-10-CM | POA: Diagnosis not present

## 2020-01-15 DIAGNOSIS — R600 Localized edema: Secondary | ICD-10-CM | POA: Diagnosis not present

## 2020-01-15 DIAGNOSIS — I878 Other specified disorders of veins: Secondary | ICD-10-CM | POA: Diagnosis not present

## 2020-01-19 DIAGNOSIS — J449 Chronic obstructive pulmonary disease, unspecified: Secondary | ICD-10-CM | POA: Diagnosis not present

## 2020-01-19 DIAGNOSIS — E039 Hypothyroidism, unspecified: Secondary | ICD-10-CM | POA: Diagnosis not present

## 2020-01-19 DIAGNOSIS — M3500 Sicca syndrome, unspecified: Secondary | ICD-10-CM | POA: Diagnosis not present

## 2020-01-19 DIAGNOSIS — E11622 Type 2 diabetes mellitus with other skin ulcer: Secondary | ICD-10-CM | POA: Diagnosis not present

## 2020-01-19 DIAGNOSIS — Z87891 Personal history of nicotine dependence: Secondary | ICD-10-CM | POA: Diagnosis not present

## 2020-01-19 DIAGNOSIS — Z4800 Encounter for change or removal of nonsurgical wound dressing: Secondary | ICD-10-CM | POA: Diagnosis not present

## 2020-01-19 DIAGNOSIS — L97911 Non-pressure chronic ulcer of unspecified part of right lower leg limited to breakdown of skin: Secondary | ICD-10-CM | POA: Diagnosis not present

## 2020-01-19 DIAGNOSIS — Z794 Long term (current) use of insulin: Secondary | ICD-10-CM | POA: Diagnosis not present

## 2020-01-19 DIAGNOSIS — M1990 Unspecified osteoarthritis, unspecified site: Secondary | ICD-10-CM | POA: Diagnosis not present

## 2020-01-19 DIAGNOSIS — Z9981 Dependence on supplemental oxygen: Secondary | ICD-10-CM | POA: Diagnosis not present

## 2020-01-19 DIAGNOSIS — Z7952 Long term (current) use of systemic steroids: Secondary | ICD-10-CM | POA: Diagnosis not present

## 2020-01-19 DIAGNOSIS — I10 Essential (primary) hypertension: Secondary | ICD-10-CM | POA: Diagnosis not present

## 2020-01-19 DIAGNOSIS — M069 Rheumatoid arthritis, unspecified: Secondary | ICD-10-CM | POA: Diagnosis not present

## 2020-01-19 DIAGNOSIS — G4700 Insomnia, unspecified: Secondary | ICD-10-CM | POA: Diagnosis not present

## 2020-01-19 DIAGNOSIS — Z79899 Other long term (current) drug therapy: Secondary | ICD-10-CM | POA: Diagnosis not present

## 2020-01-20 DIAGNOSIS — I10 Essential (primary) hypertension: Secondary | ICD-10-CM | POA: Diagnosis not present

## 2020-01-23 DIAGNOSIS — E11622 Type 2 diabetes mellitus with other skin ulcer: Secondary | ICD-10-CM | POA: Diagnosis not present

## 2020-01-23 DIAGNOSIS — M3500 Sicca syndrome, unspecified: Secondary | ICD-10-CM | POA: Diagnosis not present

## 2020-01-23 DIAGNOSIS — I10 Essential (primary) hypertension: Secondary | ICD-10-CM | POA: Diagnosis not present

## 2020-01-23 DIAGNOSIS — E039 Hypothyroidism, unspecified: Secondary | ICD-10-CM | POA: Diagnosis not present

## 2020-01-23 DIAGNOSIS — L97911 Non-pressure chronic ulcer of unspecified part of right lower leg limited to breakdown of skin: Secondary | ICD-10-CM | POA: Diagnosis not present

## 2020-01-23 DIAGNOSIS — Z4800 Encounter for change or removal of nonsurgical wound dressing: Secondary | ICD-10-CM | POA: Diagnosis not present

## 2020-02-08 DIAGNOSIS — M069 Rheumatoid arthritis, unspecified: Secondary | ICD-10-CM | POA: Diagnosis not present

## 2020-02-08 DIAGNOSIS — I10 Essential (primary) hypertension: Secondary | ICD-10-CM | POA: Diagnosis not present

## 2020-02-08 DIAGNOSIS — Z885 Allergy status to narcotic agent status: Secondary | ICD-10-CM | POA: Diagnosis not present

## 2020-02-08 DIAGNOSIS — Z87891 Personal history of nicotine dependence: Secondary | ICD-10-CM | POA: Diagnosis not present

## 2020-02-08 DIAGNOSIS — Z79899 Other long term (current) drug therapy: Secondary | ICD-10-CM | POA: Diagnosis not present

## 2020-02-08 DIAGNOSIS — E669 Obesity, unspecified: Secondary | ICD-10-CM | POA: Diagnosis not present

## 2020-02-08 DIAGNOSIS — Z90710 Acquired absence of both cervix and uterus: Secondary | ICD-10-CM | POA: Diagnosis not present

## 2020-02-08 DIAGNOSIS — I878 Other specified disorders of veins: Secondary | ICD-10-CM | POA: Diagnosis not present

## 2020-02-08 DIAGNOSIS — E785 Hyperlipidemia, unspecified: Secondary | ICD-10-CM | POA: Diagnosis not present

## 2020-02-08 DIAGNOSIS — Z7982 Long term (current) use of aspirin: Secondary | ICD-10-CM | POA: Diagnosis not present

## 2020-02-08 DIAGNOSIS — Z9049 Acquired absence of other specified parts of digestive tract: Secondary | ICD-10-CM | POA: Diagnosis not present

## 2020-02-08 DIAGNOSIS — J449 Chronic obstructive pulmonary disease, unspecified: Secondary | ICD-10-CM | POA: Diagnosis not present

## 2020-02-08 DIAGNOSIS — Z882 Allergy status to sulfonamides status: Secondary | ICD-10-CM | POA: Diagnosis not present

## 2020-02-08 DIAGNOSIS — E119 Type 2 diabetes mellitus without complications: Secondary | ICD-10-CM | POA: Diagnosis not present

## 2020-02-08 DIAGNOSIS — E039 Hypothyroidism, unspecified: Secondary | ICD-10-CM | POA: Diagnosis not present

## 2020-02-08 DIAGNOSIS — Z79890 Hormone replacement therapy (postmenopausal): Secondary | ICD-10-CM | POA: Diagnosis not present

## 2020-02-20 DIAGNOSIS — I10 Essential (primary) hypertension: Secondary | ICD-10-CM | POA: Diagnosis not present

## 2020-02-26 ENCOUNTER — Other Ambulatory Visit: Payer: Self-pay

## 2020-02-26 DIAGNOSIS — I713 Abdominal aortic aneurysm, ruptured, unspecified: Secondary | ICD-10-CM

## 2020-02-28 ENCOUNTER — Other Ambulatory Visit: Payer: Self-pay | Admitting: Physician Assistant

## 2020-02-28 DIAGNOSIS — I713 Abdominal aortic aneurysm, ruptured, unspecified: Secondary | ICD-10-CM

## 2020-02-28 MED ORDER — DIAZEPAM 5 MG PO TABS: 10.0000 mg | ORAL_TABLET | Freq: Once | ORAL | 0 refills | Status: AC

## 2020-03-05 DIAGNOSIS — Z7189 Other specified counseling: Secondary | ICD-10-CM | POA: Diagnosis not present

## 2020-03-05 DIAGNOSIS — E039 Hypothyroidism, unspecified: Secondary | ICD-10-CM | POA: Diagnosis not present

## 2020-03-05 DIAGNOSIS — I10 Essential (primary) hypertension: Secondary | ICD-10-CM | POA: Diagnosis not present

## 2020-03-05 DIAGNOSIS — R5383 Other fatigue: Secondary | ICD-10-CM | POA: Diagnosis not present

## 2020-03-05 DIAGNOSIS — Z299 Encounter for prophylactic measures, unspecified: Secondary | ICD-10-CM | POA: Diagnosis not present

## 2020-03-05 DIAGNOSIS — Z1331 Encounter for screening for depression: Secondary | ICD-10-CM | POA: Diagnosis not present

## 2020-03-05 DIAGNOSIS — E7800 Pure hypercholesterolemia, unspecified: Secondary | ICD-10-CM | POA: Diagnosis not present

## 2020-03-05 DIAGNOSIS — Z79899 Other long term (current) drug therapy: Secondary | ICD-10-CM | POA: Diagnosis not present

## 2020-03-05 DIAGNOSIS — Z6834 Body mass index (BMI) 34.0-34.9, adult: Secondary | ICD-10-CM | POA: Diagnosis not present

## 2020-03-05 DIAGNOSIS — Z1339 Encounter for screening examination for other mental health and behavioral disorders: Secondary | ICD-10-CM | POA: Diagnosis not present

## 2020-03-05 DIAGNOSIS — Z0000 Encounter for general adult medical examination without abnormal findings: Secondary | ICD-10-CM | POA: Diagnosis not present

## 2020-03-12 ENCOUNTER — Encounter: Payer: Medicare Other | Admitting: Vascular Surgery

## 2020-03-12 DIAGNOSIS — J449 Chronic obstructive pulmonary disease, unspecified: Secondary | ICD-10-CM | POA: Diagnosis not present

## 2020-03-12 DIAGNOSIS — Z299 Encounter for prophylactic measures, unspecified: Secondary | ICD-10-CM | POA: Diagnosis not present

## 2020-03-12 DIAGNOSIS — E1165 Type 2 diabetes mellitus with hyperglycemia: Secondary | ICD-10-CM | POA: Diagnosis not present

## 2020-03-12 DIAGNOSIS — R609 Edema, unspecified: Secondary | ICD-10-CM | POA: Diagnosis not present

## 2020-03-12 DIAGNOSIS — I10 Essential (primary) hypertension: Secondary | ICD-10-CM | POA: Diagnosis not present

## 2020-03-12 DIAGNOSIS — J441 Chronic obstructive pulmonary disease with (acute) exacerbation: Secondary | ICD-10-CM | POA: Diagnosis not present

## 2020-03-13 ENCOUNTER — Encounter: Payer: Medicare Other | Admitting: Vascular Surgery

## 2020-03-20 ENCOUNTER — Encounter: Payer: Medicare Other | Admitting: Vascular Surgery

## 2020-03-21 DIAGNOSIS — I10 Essential (primary) hypertension: Secondary | ICD-10-CM | POA: Diagnosis not present

## 2020-04-10 ENCOUNTER — Other Ambulatory Visit: Payer: Medicare Other

## 2020-04-14 DIAGNOSIS — R600 Localized edema: Secondary | ICD-10-CM | POA: Diagnosis not present

## 2020-04-22 DIAGNOSIS — L110 Acquired keratosis follicularis: Secondary | ICD-10-CM | POA: Diagnosis not present

## 2020-04-22 DIAGNOSIS — E1140 Type 2 diabetes mellitus with diabetic neuropathy, unspecified: Secondary | ICD-10-CM | POA: Diagnosis not present

## 2020-04-22 DIAGNOSIS — M79671 Pain in right foot: Secondary | ICD-10-CM | POA: Diagnosis not present

## 2020-04-22 DIAGNOSIS — M79675 Pain in left toe(s): Secondary | ICD-10-CM | POA: Diagnosis not present

## 2020-04-22 DIAGNOSIS — M79672 Pain in left foot: Secondary | ICD-10-CM | POA: Diagnosis not present

## 2020-04-22 DIAGNOSIS — M79674 Pain in right toe(s): Secondary | ICD-10-CM | POA: Diagnosis not present

## 2020-04-22 DIAGNOSIS — I10 Essential (primary) hypertension: Secondary | ICD-10-CM | POA: Diagnosis not present

## 2020-04-22 DIAGNOSIS — I739 Peripheral vascular disease, unspecified: Secondary | ICD-10-CM | POA: Diagnosis not present

## 2020-04-25 DIAGNOSIS — R0789 Other chest pain: Secondary | ICD-10-CM | POA: Diagnosis not present

## 2020-04-25 DIAGNOSIS — Z299 Encounter for prophylactic measures, unspecified: Secondary | ICD-10-CM | POA: Diagnosis not present

## 2020-04-25 DIAGNOSIS — R9389 Abnormal findings on diagnostic imaging of other specified body structures: Secondary | ICD-10-CM | POA: Diagnosis not present

## 2020-04-25 DIAGNOSIS — M069 Rheumatoid arthritis, unspecified: Secondary | ICD-10-CM | POA: Diagnosis not present

## 2020-04-25 DIAGNOSIS — R079 Chest pain, unspecified: Secondary | ICD-10-CM | POA: Diagnosis not present

## 2020-04-25 DIAGNOSIS — S2231XA Fracture of one rib, right side, initial encounter for closed fracture: Secondary | ICD-10-CM | POA: Diagnosis not present

## 2020-04-25 DIAGNOSIS — I517 Cardiomegaly: Secondary | ICD-10-CM | POA: Diagnosis not present

## 2020-04-25 DIAGNOSIS — I700 Atherosclerosis of aorta: Secondary | ICD-10-CM | POA: Diagnosis not present

## 2020-04-25 DIAGNOSIS — S2239XA Fracture of one rib, unspecified side, initial encounter for closed fracture: Secondary | ICD-10-CM | POA: Diagnosis not present

## 2020-04-25 DIAGNOSIS — R918 Other nonspecific abnormal finding of lung field: Secondary | ICD-10-CM | POA: Diagnosis not present

## 2020-04-25 DIAGNOSIS — J449 Chronic obstructive pulmonary disease, unspecified: Secondary | ICD-10-CM | POA: Diagnosis not present

## 2020-04-25 DIAGNOSIS — I10 Essential (primary) hypertension: Secondary | ICD-10-CM | POA: Diagnosis not present

## 2020-04-26 DIAGNOSIS — R0789 Other chest pain: Secondary | ICD-10-CM | POA: Diagnosis not present

## 2020-04-26 DIAGNOSIS — J449 Chronic obstructive pulmonary disease, unspecified: Secondary | ICD-10-CM | POA: Diagnosis not present

## 2020-04-26 DIAGNOSIS — M069 Rheumatoid arthritis, unspecified: Secondary | ICD-10-CM | POA: Diagnosis not present

## 2020-04-26 DIAGNOSIS — R911 Solitary pulmonary nodule: Secondary | ICD-10-CM | POA: Diagnosis not present

## 2020-04-26 DIAGNOSIS — E039 Hypothyroidism, unspecified: Secondary | ICD-10-CM | POA: Diagnosis not present

## 2020-04-26 DIAGNOSIS — Z87891 Personal history of nicotine dependence: Secondary | ICD-10-CM | POA: Diagnosis not present

## 2020-04-26 DIAGNOSIS — K589 Irritable bowel syndrome without diarrhea: Secondary | ICD-10-CM | POA: Diagnosis not present

## 2020-04-26 DIAGNOSIS — I10 Essential (primary) hypertension: Secondary | ICD-10-CM | POA: Diagnosis not present

## 2020-04-26 DIAGNOSIS — R791 Abnormal coagulation profile: Secondary | ICD-10-CM | POA: Diagnosis not present

## 2020-04-26 DIAGNOSIS — R0781 Pleurodynia: Secondary | ICD-10-CM | POA: Diagnosis not present

## 2020-04-26 DIAGNOSIS — E119 Type 2 diabetes mellitus without complications: Secondary | ICD-10-CM | POA: Diagnosis not present

## 2020-04-26 DIAGNOSIS — E785 Hyperlipidemia, unspecified: Secondary | ICD-10-CM | POA: Diagnosis not present

## 2020-04-26 DIAGNOSIS — Z90710 Acquired absence of both cervix and uterus: Secondary | ICD-10-CM | POA: Diagnosis not present

## 2020-04-26 DIAGNOSIS — M549 Dorsalgia, unspecified: Secondary | ICD-10-CM | POA: Diagnosis not present

## 2020-04-26 DIAGNOSIS — Z9049 Acquired absence of other specified parts of digestive tract: Secondary | ICD-10-CM | POA: Diagnosis not present

## 2020-05-07 DIAGNOSIS — M898X1 Other specified disorders of bone, shoulder: Secondary | ICD-10-CM | POA: Diagnosis not present

## 2020-05-07 DIAGNOSIS — I10 Essential (primary) hypertension: Secondary | ICD-10-CM | POA: Diagnosis not present

## 2020-05-07 DIAGNOSIS — K5900 Constipation, unspecified: Secondary | ICD-10-CM | POA: Diagnosis not present

## 2020-05-07 DIAGNOSIS — E1165 Type 2 diabetes mellitus with hyperglycemia: Secondary | ICD-10-CM | POA: Diagnosis not present

## 2020-05-07 DIAGNOSIS — Z299 Encounter for prophylactic measures, unspecified: Secondary | ICD-10-CM | POA: Diagnosis not present

## 2020-05-07 DIAGNOSIS — G8929 Other chronic pain: Secondary | ICD-10-CM | POA: Diagnosis not present

## 2020-05-08 ENCOUNTER — Inpatient Hospital Stay: Admission: RE | Admit: 2020-05-08 | Payer: Medicare Other | Source: Ambulatory Visit

## 2020-05-15 ENCOUNTER — Telehealth: Payer: Self-pay | Admitting: Vascular Surgery

## 2020-05-15 ENCOUNTER — Encounter: Payer: Self-pay | Admitting: Vascular Surgery

## 2020-05-15 ENCOUNTER — Encounter: Payer: Medicare Other | Admitting: Vascular Surgery

## 2020-05-15 NOTE — Progress Notes (Signed)
Patient has no showed for several prior appointments for CT scan of the chest abdomen and pelvis.  We tried multiple avenues with the patient recently including sedation and other methods to try to get her CT scan performed but unfortunately she still has not had the CT scan.  We will cancel her appointment today since there is really no new information without a new CT scan.  If she wishes to reschedule we would order a noncontrast CT scan of the chest abdomen and pelvis.  Otherwise she will follow up on an as-needed basis.  She has a known a ascending and descending thoracic aortic aneurysm which was last evaluated by CT scan approximately 1 year ago.  At that point the ascending aneurysm was around 4 cm and the descending 3-1/2.  Ruta Hinds, MD [facility] Office: [phone]

## 2020-05-15 NOTE — Telephone Encounter (Signed)
Per Dr. Oneida Alar pt can have one more CT scan w/o contrast if she wants to. If she doesn't then she will be dismissed from the practice. I called pt and told her and she said she didn't  Know if she can do it and she would call back.

## 2020-05-16 DIAGNOSIS — R918 Other nonspecific abnormal finding of lung field: Secondary | ICD-10-CM | POA: Diagnosis not present

## 2020-05-16 DIAGNOSIS — R0789 Other chest pain: Secondary | ICD-10-CM | POA: Diagnosis not present

## 2020-05-16 DIAGNOSIS — R911 Solitary pulmonary nodule: Secondary | ICD-10-CM | POA: Diagnosis not present

## 2020-05-16 DIAGNOSIS — I517 Cardiomegaly: Secondary | ICD-10-CM | POA: Diagnosis not present

## 2020-05-16 DIAGNOSIS — J984 Other disorders of lung: Secondary | ICD-10-CM | POA: Diagnosis not present

## 2020-05-22 DIAGNOSIS — I10 Essential (primary) hypertension: Secondary | ICD-10-CM | POA: Diagnosis not present

## 2020-05-23 DIAGNOSIS — E1165 Type 2 diabetes mellitus with hyperglycemia: Secondary | ICD-10-CM | POA: Diagnosis not present

## 2020-05-23 DIAGNOSIS — M329 Systemic lupus erythematosus, unspecified: Secondary | ICD-10-CM | POA: Diagnosis not present

## 2020-05-23 DIAGNOSIS — Z299 Encounter for prophylactic measures, unspecified: Secondary | ICD-10-CM | POA: Diagnosis not present

## 2020-05-23 DIAGNOSIS — Z6834 Body mass index (BMI) 34.0-34.9, adult: Secondary | ICD-10-CM | POA: Diagnosis not present

## 2020-05-23 DIAGNOSIS — M069 Rheumatoid arthritis, unspecified: Secondary | ICD-10-CM | POA: Diagnosis not present

## 2020-05-23 DIAGNOSIS — M3500 Sicca syndrome, unspecified: Secondary | ICD-10-CM | POA: Diagnosis not present

## 2020-05-23 DIAGNOSIS — I10 Essential (primary) hypertension: Secondary | ICD-10-CM | POA: Diagnosis not present

## 2020-06-10 DIAGNOSIS — I10 Essential (primary) hypertension: Secondary | ICD-10-CM | POA: Diagnosis not present

## 2020-06-10 DIAGNOSIS — M3500 Sicca syndrome, unspecified: Secondary | ICD-10-CM | POA: Diagnosis not present

## 2020-06-10 DIAGNOSIS — J449 Chronic obstructive pulmonary disease, unspecified: Secondary | ICD-10-CM | POA: Diagnosis not present

## 2020-06-10 DIAGNOSIS — E1165 Type 2 diabetes mellitus with hyperglycemia: Secondary | ICD-10-CM | POA: Diagnosis not present

## 2020-06-10 DIAGNOSIS — R0789 Other chest pain: Secondary | ICD-10-CM | POA: Diagnosis not present

## 2020-06-10 DIAGNOSIS — Z299 Encounter for prophylactic measures, unspecified: Secondary | ICD-10-CM | POA: Diagnosis not present

## 2020-06-17 DIAGNOSIS — Z6832 Body mass index (BMI) 32.0-32.9, adult: Secondary | ICD-10-CM | POA: Diagnosis not present

## 2020-06-17 DIAGNOSIS — R350 Frequency of micturition: Secondary | ICD-10-CM | POA: Diagnosis not present

## 2020-06-17 DIAGNOSIS — Z87891 Personal history of nicotine dependence: Secondary | ICD-10-CM | POA: Diagnosis not present

## 2020-06-17 DIAGNOSIS — I10 Essential (primary) hypertension: Secondary | ICD-10-CM | POA: Diagnosis not present

## 2020-06-17 DIAGNOSIS — Z299 Encounter for prophylactic measures, unspecified: Secondary | ICD-10-CM | POA: Diagnosis not present

## 2020-06-17 DIAGNOSIS — N390 Urinary tract infection, site not specified: Secondary | ICD-10-CM | POA: Diagnosis not present

## 2020-06-17 DIAGNOSIS — J449 Chronic obstructive pulmonary disease, unspecified: Secondary | ICD-10-CM | POA: Diagnosis not present

## 2020-06-17 DIAGNOSIS — E1165 Type 2 diabetes mellitus with hyperglycemia: Secondary | ICD-10-CM | POA: Diagnosis not present

## 2020-06-21 DIAGNOSIS — I10 Essential (primary) hypertension: Secondary | ICD-10-CM | POA: Diagnosis not present

## 2020-07-04 DIAGNOSIS — I10 Essential (primary) hypertension: Secondary | ICD-10-CM | POA: Diagnosis not present

## 2020-07-04 DIAGNOSIS — R197 Diarrhea, unspecified: Secondary | ICD-10-CM | POA: Diagnosis not present

## 2020-07-04 DIAGNOSIS — F419 Anxiety disorder, unspecified: Secondary | ICD-10-CM | POA: Diagnosis not present

## 2020-07-09 DIAGNOSIS — M545 Low back pain, unspecified: Secondary | ICD-10-CM | POA: Diagnosis not present

## 2020-07-21 DIAGNOSIS — Z299 Encounter for prophylactic measures, unspecified: Secondary | ICD-10-CM | POA: Diagnosis not present

## 2020-07-21 DIAGNOSIS — M329 Systemic lupus erythematosus, unspecified: Secondary | ICD-10-CM | POA: Diagnosis not present

## 2020-07-21 DIAGNOSIS — J449 Chronic obstructive pulmonary disease, unspecified: Secondary | ICD-10-CM | POA: Diagnosis not present

## 2020-07-21 DIAGNOSIS — I10 Essential (primary) hypertension: Secondary | ICD-10-CM | POA: Diagnosis not present

## 2020-07-21 DIAGNOSIS — R300 Dysuria: Secondary | ICD-10-CM | POA: Diagnosis not present

## 2020-07-21 DIAGNOSIS — M549 Dorsalgia, unspecified: Secondary | ICD-10-CM | POA: Diagnosis not present

## 2020-07-22 DIAGNOSIS — I10 Essential (primary) hypertension: Secondary | ICD-10-CM | POA: Diagnosis not present

## 2020-08-08 DIAGNOSIS — M549 Dorsalgia, unspecified: Secondary | ICD-10-CM | POA: Diagnosis not present

## 2020-08-08 DIAGNOSIS — M25512 Pain in left shoulder: Secondary | ICD-10-CM | POA: Diagnosis not present

## 2020-08-08 DIAGNOSIS — Z299 Encounter for prophylactic measures, unspecified: Secondary | ICD-10-CM | POA: Diagnosis not present

## 2020-08-08 DIAGNOSIS — I10 Essential (primary) hypertension: Secondary | ICD-10-CM | POA: Diagnosis not present

## 2020-08-08 DIAGNOSIS — N390 Urinary tract infection, site not specified: Secondary | ICD-10-CM | POA: Diagnosis not present

## 2020-08-16 DIAGNOSIS — M545 Low back pain, unspecified: Secondary | ICD-10-CM | POA: Diagnosis not present

## 2020-08-16 DIAGNOSIS — K5909 Other constipation: Secondary | ICD-10-CM | POA: Diagnosis not present

## 2020-08-21 DIAGNOSIS — I10 Essential (primary) hypertension: Secondary | ICD-10-CM | POA: Diagnosis not present

## 2020-09-01 DIAGNOSIS — N183 Chronic kidney disease, stage 3 unspecified: Secondary | ICD-10-CM | POA: Diagnosis not present

## 2020-09-01 DIAGNOSIS — I10 Essential (primary) hypertension: Secondary | ICD-10-CM | POA: Diagnosis not present

## 2020-09-01 DIAGNOSIS — E1122 Type 2 diabetes mellitus with diabetic chronic kidney disease: Secondary | ICD-10-CM | POA: Diagnosis not present

## 2020-09-01 DIAGNOSIS — M25551 Pain in right hip: Secondary | ICD-10-CM | POA: Diagnosis not present

## 2020-09-01 DIAGNOSIS — J449 Chronic obstructive pulmonary disease, unspecified: Secondary | ICD-10-CM | POA: Diagnosis not present

## 2020-09-01 DIAGNOSIS — Z299 Encounter for prophylactic measures, unspecified: Secondary | ICD-10-CM | POA: Diagnosis not present

## 2020-09-01 DIAGNOSIS — M545 Low back pain, unspecified: Secondary | ICD-10-CM | POA: Diagnosis not present

## 2020-09-01 DIAGNOSIS — E1165 Type 2 diabetes mellitus with hyperglycemia: Secondary | ICD-10-CM | POA: Diagnosis not present

## 2020-09-01 DIAGNOSIS — S22089A Unspecified fracture of T11-T12 vertebra, initial encounter for closed fracture: Secondary | ICD-10-CM | POA: Diagnosis not present

## 2020-09-05 DIAGNOSIS — Z23 Encounter for immunization: Secondary | ICD-10-CM | POA: Diagnosis not present

## 2020-09-16 DIAGNOSIS — Z23 Encounter for immunization: Secondary | ICD-10-CM | POA: Diagnosis not present

## 2020-09-18 DIAGNOSIS — Z299 Encounter for prophylactic measures, unspecified: Secondary | ICD-10-CM | POA: Diagnosis not present

## 2020-09-18 DIAGNOSIS — M545 Low back pain, unspecified: Secondary | ICD-10-CM | POA: Diagnosis not present

## 2020-09-18 DIAGNOSIS — M329 Systemic lupus erythematosus, unspecified: Secondary | ICD-10-CM | POA: Diagnosis not present

## 2020-09-18 DIAGNOSIS — J9611 Chronic respiratory failure with hypoxia: Secondary | ICD-10-CM | POA: Diagnosis not present

## 2020-09-18 DIAGNOSIS — J449 Chronic obstructive pulmonary disease, unspecified: Secondary | ICD-10-CM | POA: Diagnosis not present

## 2020-09-22 DIAGNOSIS — I10 Essential (primary) hypertension: Secondary | ICD-10-CM | POA: Diagnosis not present

## 2020-09-25 DIAGNOSIS — M79675 Pain in left toe(s): Secondary | ICD-10-CM | POA: Diagnosis not present

## 2020-09-25 DIAGNOSIS — M79671 Pain in right foot: Secondary | ICD-10-CM | POA: Diagnosis not present

## 2020-09-25 DIAGNOSIS — E1140 Type 2 diabetes mellitus with diabetic neuropathy, unspecified: Secondary | ICD-10-CM | POA: Diagnosis not present

## 2020-09-25 DIAGNOSIS — M79674 Pain in right toe(s): Secondary | ICD-10-CM | POA: Diagnosis not present

## 2020-09-25 DIAGNOSIS — M79672 Pain in left foot: Secondary | ICD-10-CM | POA: Diagnosis not present

## 2020-09-25 DIAGNOSIS — L110 Acquired keratosis follicularis: Secondary | ICD-10-CM | POA: Diagnosis not present

## 2020-09-25 DIAGNOSIS — I739 Peripheral vascular disease, unspecified: Secondary | ICD-10-CM | POA: Diagnosis not present

## 2020-10-07 DIAGNOSIS — I10 Essential (primary) hypertension: Secondary | ICD-10-CM | POA: Diagnosis not present

## 2020-10-07 DIAGNOSIS — J449 Chronic obstructive pulmonary disease, unspecified: Secondary | ICD-10-CM | POA: Diagnosis not present

## 2020-10-07 DIAGNOSIS — N390 Urinary tract infection, site not specified: Secondary | ICD-10-CM | POA: Diagnosis not present

## 2020-10-07 DIAGNOSIS — M549 Dorsalgia, unspecified: Secondary | ICD-10-CM | POA: Diagnosis not present

## 2020-10-07 DIAGNOSIS — M3500 Sicca syndrome, unspecified: Secondary | ICD-10-CM | POA: Diagnosis not present

## 2020-10-07 DIAGNOSIS — Z299 Encounter for prophylactic measures, unspecified: Secondary | ICD-10-CM | POA: Diagnosis not present

## 2020-10-20 DIAGNOSIS — I10 Essential (primary) hypertension: Secondary | ICD-10-CM | POA: Diagnosis not present

## 2020-10-21 DIAGNOSIS — I10 Essential (primary) hypertension: Secondary | ICD-10-CM | POA: Diagnosis not present

## 2020-10-21 DIAGNOSIS — E1165 Type 2 diabetes mellitus with hyperglycemia: Secondary | ICD-10-CM | POA: Diagnosis not present

## 2020-10-21 DIAGNOSIS — Z299 Encounter for prophylactic measures, unspecified: Secondary | ICD-10-CM | POA: Diagnosis not present

## 2020-10-21 DIAGNOSIS — N390 Urinary tract infection, site not specified: Secondary | ICD-10-CM | POA: Diagnosis not present

## 2020-10-21 DIAGNOSIS — J449 Chronic obstructive pulmonary disease, unspecified: Secondary | ICD-10-CM | POA: Diagnosis not present

## 2020-11-05 DIAGNOSIS — M329 Systemic lupus erythematosus, unspecified: Secondary | ICD-10-CM | POA: Diagnosis not present

## 2020-11-05 DIAGNOSIS — L97909 Non-pressure chronic ulcer of unspecified part of unspecified lower leg with unspecified severity: Secondary | ICD-10-CM | POA: Diagnosis not present

## 2020-11-05 DIAGNOSIS — D692 Other nonthrombocytopenic purpura: Secondary | ICD-10-CM | POA: Diagnosis not present

## 2020-11-05 DIAGNOSIS — Z299 Encounter for prophylactic measures, unspecified: Secondary | ICD-10-CM | POA: Diagnosis not present

## 2020-11-05 DIAGNOSIS — L97911 Non-pressure chronic ulcer of unspecified part of right lower leg limited to breakdown of skin: Secondary | ICD-10-CM | POA: Diagnosis not present

## 2020-11-20 DIAGNOSIS — G4733 Obstructive sleep apnea (adult) (pediatric): Secondary | ICD-10-CM | POA: Diagnosis not present

## 2020-11-20 DIAGNOSIS — J449 Chronic obstructive pulmonary disease, unspecified: Secondary | ICD-10-CM | POA: Diagnosis not present

## 2020-11-20 DIAGNOSIS — I712 Thoracic aortic aneurysm, without rupture: Secondary | ICD-10-CM | POA: Diagnosis not present

## 2020-11-20 DIAGNOSIS — I10 Essential (primary) hypertension: Secondary | ICD-10-CM | POA: Diagnosis not present

## 2020-11-20 DIAGNOSIS — E6609 Other obesity due to excess calories: Secondary | ICD-10-CM | POA: Diagnosis not present

## 2020-11-20 DIAGNOSIS — Z6830 Body mass index (BMI) 30.0-30.9, adult: Secondary | ICD-10-CM | POA: Diagnosis not present

## 2020-11-20 DIAGNOSIS — R5382 Chronic fatigue, unspecified: Secondary | ICD-10-CM | POA: Diagnosis not present

## 2020-11-20 DIAGNOSIS — E119 Type 2 diabetes mellitus without complications: Secondary | ICD-10-CM | POA: Diagnosis not present

## 2020-12-04 DIAGNOSIS — E1122 Type 2 diabetes mellitus with diabetic chronic kidney disease: Secondary | ICD-10-CM | POA: Diagnosis not present

## 2020-12-04 DIAGNOSIS — J209 Acute bronchitis, unspecified: Secondary | ICD-10-CM | POA: Diagnosis not present

## 2020-12-04 DIAGNOSIS — Z299 Encounter for prophylactic measures, unspecified: Secondary | ICD-10-CM | POA: Diagnosis not present

## 2020-12-04 DIAGNOSIS — E1165 Type 2 diabetes mellitus with hyperglycemia: Secondary | ICD-10-CM | POA: Diagnosis not present

## 2020-12-04 DIAGNOSIS — J440 Chronic obstructive pulmonary disease with acute lower respiratory infection: Secondary | ICD-10-CM | POA: Diagnosis not present

## 2020-12-20 DIAGNOSIS — I10 Essential (primary) hypertension: Secondary | ICD-10-CM | POA: Diagnosis not present

## 2020-12-25 DIAGNOSIS — I10 Essential (primary) hypertension: Secondary | ICD-10-CM | POA: Diagnosis not present

## 2020-12-25 DIAGNOSIS — I081 Rheumatic disorders of both mitral and tricuspid valves: Secondary | ICD-10-CM | POA: Diagnosis not present

## 2020-12-25 DIAGNOSIS — I272 Pulmonary hypertension, unspecified: Secondary | ICD-10-CM | POA: Diagnosis not present

## 2020-12-29 DIAGNOSIS — I10 Essential (primary) hypertension: Secondary | ICD-10-CM | POA: Diagnosis not present

## 2021-01-01 DIAGNOSIS — E1140 Type 2 diabetes mellitus with diabetic neuropathy, unspecified: Secondary | ICD-10-CM | POA: Diagnosis not present

## 2021-01-01 DIAGNOSIS — I739 Peripheral vascular disease, unspecified: Secondary | ICD-10-CM | POA: Diagnosis not present

## 2021-01-01 DIAGNOSIS — M79675 Pain in left toe(s): Secondary | ICD-10-CM | POA: Diagnosis not present

## 2021-01-01 DIAGNOSIS — L110 Acquired keratosis follicularis: Secondary | ICD-10-CM | POA: Diagnosis not present

## 2021-01-01 DIAGNOSIS — M79672 Pain in left foot: Secondary | ICD-10-CM | POA: Diagnosis not present

## 2021-01-01 DIAGNOSIS — M79674 Pain in right toe(s): Secondary | ICD-10-CM | POA: Diagnosis not present

## 2021-01-01 DIAGNOSIS — M79671 Pain in right foot: Secondary | ICD-10-CM | POA: Diagnosis not present

## 2021-01-06 DIAGNOSIS — Z299 Encounter for prophylactic measures, unspecified: Secondary | ICD-10-CM | POA: Diagnosis not present

## 2021-01-06 DIAGNOSIS — E1165 Type 2 diabetes mellitus with hyperglycemia: Secondary | ICD-10-CM | POA: Diagnosis not present

## 2021-01-06 DIAGNOSIS — E1122 Type 2 diabetes mellitus with diabetic chronic kidney disease: Secondary | ICD-10-CM | POA: Diagnosis not present

## 2021-01-06 DIAGNOSIS — N183 Chronic kidney disease, stage 3 unspecified: Secondary | ICD-10-CM | POA: Diagnosis not present

## 2021-01-06 DIAGNOSIS — R109 Unspecified abdominal pain: Secondary | ICD-10-CM | POA: Diagnosis not present

## 2021-01-06 DIAGNOSIS — I10 Essential (primary) hypertension: Secondary | ICD-10-CM | POA: Diagnosis not present

## 2021-01-06 DIAGNOSIS — M545 Low back pain, unspecified: Secondary | ICD-10-CM | POA: Diagnosis not present

## 2021-01-06 DIAGNOSIS — N390 Urinary tract infection, site not specified: Secondary | ICD-10-CM | POA: Diagnosis not present

## 2021-01-13 ENCOUNTER — Telehealth (HOSPITAL_COMMUNITY): Payer: Self-pay

## 2021-01-13 NOTE — Telephone Encounter (Signed)
Returned call to daughter, no answer, vm full. AW

## 2021-02-13 DIAGNOSIS — M25551 Pain in right hip: Secondary | ICD-10-CM | POA: Diagnosis not present

## 2021-02-13 DIAGNOSIS — M8008XA Age-related osteoporosis with current pathological fracture, vertebra(e), initial encounter for fracture: Secondary | ICD-10-CM | POA: Diagnosis not present

## 2021-02-18 DIAGNOSIS — Z299 Encounter for prophylactic measures, unspecified: Secondary | ICD-10-CM | POA: Diagnosis not present

## 2021-02-18 DIAGNOSIS — M549 Dorsalgia, unspecified: Secondary | ICD-10-CM | POA: Diagnosis not present

## 2021-02-18 DIAGNOSIS — S32000A Wedge compression fracture of unspecified lumbar vertebra, initial encounter for closed fracture: Secondary | ICD-10-CM | POA: Diagnosis not present

## 2021-02-18 DIAGNOSIS — I10 Essential (primary) hypertension: Secondary | ICD-10-CM | POA: Diagnosis not present

## 2021-02-19 DIAGNOSIS — I10 Essential (primary) hypertension: Secondary | ICD-10-CM | POA: Diagnosis not present

## 2021-03-16 DIAGNOSIS — M1710 Unilateral primary osteoarthritis, unspecified knee: Secondary | ICD-10-CM | POA: Diagnosis not present

## 2021-03-16 DIAGNOSIS — M549 Dorsalgia, unspecified: Secondary | ICD-10-CM | POA: Diagnosis not present

## 2021-03-16 DIAGNOSIS — Z299 Encounter for prophylactic measures, unspecified: Secondary | ICD-10-CM | POA: Diagnosis not present

## 2021-03-16 DIAGNOSIS — J449 Chronic obstructive pulmonary disease, unspecified: Secondary | ICD-10-CM | POA: Diagnosis not present

## 2021-03-19 DIAGNOSIS — E1140 Type 2 diabetes mellitus with diabetic neuropathy, unspecified: Secondary | ICD-10-CM | POA: Diagnosis not present

## 2021-03-19 DIAGNOSIS — M79674 Pain in right toe(s): Secondary | ICD-10-CM | POA: Diagnosis not present

## 2021-03-19 DIAGNOSIS — L110 Acquired keratosis follicularis: Secondary | ICD-10-CM | POA: Diagnosis not present

## 2021-03-19 DIAGNOSIS — M79672 Pain in left foot: Secondary | ICD-10-CM | POA: Diagnosis not present

## 2021-03-19 DIAGNOSIS — M79671 Pain in right foot: Secondary | ICD-10-CM | POA: Diagnosis not present

## 2021-03-19 DIAGNOSIS — M79675 Pain in left toe(s): Secondary | ICD-10-CM | POA: Diagnosis not present

## 2021-03-19 DIAGNOSIS — I739 Peripheral vascular disease, unspecified: Secondary | ICD-10-CM | POA: Diagnosis not present

## 2021-03-20 DIAGNOSIS — I10 Essential (primary) hypertension: Secondary | ICD-10-CM | POA: Diagnosis not present

## 2021-03-27 DIAGNOSIS — N390 Urinary tract infection, site not specified: Secondary | ICD-10-CM | POA: Diagnosis not present

## 2021-03-27 DIAGNOSIS — I10 Essential (primary) hypertension: Secondary | ICD-10-CM | POA: Diagnosis not present

## 2021-03-27 DIAGNOSIS — J441 Chronic obstructive pulmonary disease with (acute) exacerbation: Secondary | ICD-10-CM | POA: Diagnosis not present

## 2021-03-27 DIAGNOSIS — K219 Gastro-esophageal reflux disease without esophagitis: Secondary | ICD-10-CM | POA: Diagnosis not present

## 2021-03-27 DIAGNOSIS — Z299 Encounter for prophylactic measures, unspecified: Secondary | ICD-10-CM | POA: Diagnosis not present

## 2021-04-08 DIAGNOSIS — M549 Dorsalgia, unspecified: Secondary | ICD-10-CM | POA: Diagnosis not present

## 2021-04-08 DIAGNOSIS — Z299 Encounter for prophylactic measures, unspecified: Secondary | ICD-10-CM | POA: Diagnosis not present

## 2021-04-08 DIAGNOSIS — I10 Essential (primary) hypertension: Secondary | ICD-10-CM | POA: Diagnosis not present

## 2021-04-08 DIAGNOSIS — R079 Chest pain, unspecified: Secondary | ICD-10-CM | POA: Diagnosis not present

## 2021-04-21 DIAGNOSIS — Z1331 Encounter for screening for depression: Secondary | ICD-10-CM | POA: Diagnosis not present

## 2021-04-21 DIAGNOSIS — Z299 Encounter for prophylactic measures, unspecified: Secondary | ICD-10-CM | POA: Diagnosis not present

## 2021-04-21 DIAGNOSIS — Z6830 Body mass index (BMI) 30.0-30.9, adult: Secondary | ICD-10-CM | POA: Diagnosis not present

## 2021-04-21 DIAGNOSIS — Z1389 Encounter for screening for other disorder: Secondary | ICD-10-CM | POA: Diagnosis not present

## 2021-04-21 DIAGNOSIS — Z7189 Other specified counseling: Secondary | ICD-10-CM | POA: Diagnosis not present

## 2021-04-21 DIAGNOSIS — R5383 Other fatigue: Secondary | ICD-10-CM | POA: Diagnosis not present

## 2021-04-21 DIAGNOSIS — I10 Essential (primary) hypertension: Secondary | ICD-10-CM | POA: Diagnosis not present

## 2021-04-21 DIAGNOSIS — Z79899 Other long term (current) drug therapy: Secondary | ICD-10-CM | POA: Diagnosis not present

## 2021-04-21 DIAGNOSIS — E7800 Pure hypercholesterolemia, unspecified: Secondary | ICD-10-CM | POA: Diagnosis not present

## 2021-04-21 DIAGNOSIS — Z87891 Personal history of nicotine dependence: Secondary | ICD-10-CM | POA: Diagnosis not present

## 2021-04-21 DIAGNOSIS — E1165 Type 2 diabetes mellitus with hyperglycemia: Secondary | ICD-10-CM | POA: Diagnosis not present

## 2021-04-21 DIAGNOSIS — E039 Hypothyroidism, unspecified: Secondary | ICD-10-CM | POA: Diagnosis not present

## 2021-04-21 DIAGNOSIS — Z0000 Encounter for general adult medical examination without abnormal findings: Secondary | ICD-10-CM | POA: Diagnosis not present

## 2021-04-22 DIAGNOSIS — I10 Essential (primary) hypertension: Secondary | ICD-10-CM | POA: Diagnosis not present

## 2021-04-29 DIAGNOSIS — R5383 Other fatigue: Secondary | ICD-10-CM | POA: Diagnosis not present

## 2021-04-29 DIAGNOSIS — S32000A Wedge compression fracture of unspecified lumbar vertebra, initial encounter for closed fracture: Secondary | ICD-10-CM | POA: Diagnosis not present

## 2021-04-29 DIAGNOSIS — M779 Enthesopathy, unspecified: Secondary | ICD-10-CM | POA: Diagnosis not present

## 2021-04-29 DIAGNOSIS — I10 Essential (primary) hypertension: Secondary | ICD-10-CM | POA: Diagnosis not present

## 2021-04-29 DIAGNOSIS — J209 Acute bronchitis, unspecified: Secondary | ICD-10-CM | POA: Diagnosis not present

## 2021-04-29 DIAGNOSIS — J440 Chronic obstructive pulmonary disease with acute lower respiratory infection: Secondary | ICD-10-CM | POA: Diagnosis not present

## 2021-04-29 DIAGNOSIS — Z299 Encounter for prophylactic measures, unspecified: Secondary | ICD-10-CM | POA: Diagnosis not present

## 2021-05-21 DIAGNOSIS — E860 Dehydration: Secondary | ICD-10-CM | POA: Diagnosis present

## 2021-05-21 DIAGNOSIS — J9602 Acute respiratory failure with hypercapnia: Secondary | ICD-10-CM | POA: Diagnosis not present

## 2021-05-21 DIAGNOSIS — R911 Solitary pulmonary nodule: Secondary | ICD-10-CM | POA: Diagnosis not present

## 2021-05-21 DIAGNOSIS — Z79899 Other long term (current) drug therapy: Secondary | ICD-10-CM | POA: Diagnosis not present

## 2021-05-21 DIAGNOSIS — Z9981 Dependence on supplemental oxygen: Secondary | ICD-10-CM | POA: Diagnosis not present

## 2021-05-21 DIAGNOSIS — Z8616 Personal history of COVID-19: Secondary | ICD-10-CM | POA: Diagnosis not present

## 2021-05-21 DIAGNOSIS — R531 Weakness: Secondary | ICD-10-CM | POA: Diagnosis not present

## 2021-05-21 DIAGNOSIS — I129 Hypertensive chronic kidney disease with stage 1 through stage 4 chronic kidney disease, or unspecified chronic kidney disease: Secondary | ICD-10-CM | POA: Diagnosis present

## 2021-05-21 DIAGNOSIS — B9689 Other specified bacterial agents as the cause of diseases classified elsewhere: Secondary | ICD-10-CM | POA: Diagnosis not present

## 2021-05-21 DIAGNOSIS — J9621 Acute and chronic respiratory failure with hypoxia: Secondary | ICD-10-CM | POA: Diagnosis present

## 2021-05-21 DIAGNOSIS — R0602 Shortness of breath: Secondary | ICD-10-CM | POA: Diagnosis not present

## 2021-05-21 DIAGNOSIS — Z792 Long term (current) use of antibiotics: Secondary | ICD-10-CM | POA: Diagnosis not present

## 2021-05-21 DIAGNOSIS — J9622 Acute and chronic respiratory failure with hypercapnia: Secondary | ICD-10-CM | POA: Diagnosis not present

## 2021-05-21 DIAGNOSIS — R5381 Other malaise: Secondary | ICD-10-CM | POA: Diagnosis not present

## 2021-05-21 DIAGNOSIS — N189 Chronic kidney disease, unspecified: Secondary | ICD-10-CM | POA: Diagnosis present

## 2021-05-21 DIAGNOSIS — R0600 Dyspnea, unspecified: Secondary | ICD-10-CM | POA: Diagnosis not present

## 2021-05-21 DIAGNOSIS — J189 Pneumonia, unspecified organism: Secondary | ICD-10-CM | POA: Diagnosis not present

## 2021-05-21 DIAGNOSIS — M797 Fibromyalgia: Secondary | ICD-10-CM | POA: Diagnosis present

## 2021-05-21 DIAGNOSIS — I959 Hypotension, unspecified: Secondary | ICD-10-CM | POA: Diagnosis present

## 2021-05-21 DIAGNOSIS — Z7952 Long term (current) use of systemic steroids: Secondary | ICD-10-CM | POA: Diagnosis not present

## 2021-05-21 DIAGNOSIS — I517 Cardiomegaly: Secondary | ICD-10-CM | POA: Diagnosis not present

## 2021-05-21 DIAGNOSIS — E785 Hyperlipidemia, unspecified: Secondary | ICD-10-CM | POA: Diagnosis present

## 2021-05-21 DIAGNOSIS — J441 Chronic obstructive pulmonary disease with (acute) exacerbation: Secondary | ICD-10-CM | POA: Diagnosis not present

## 2021-05-21 DIAGNOSIS — A419 Sepsis, unspecified organism: Secondary | ICD-10-CM | POA: Diagnosis not present

## 2021-05-21 DIAGNOSIS — Z87891 Personal history of nicotine dependence: Secondary | ICD-10-CM | POA: Diagnosis not present

## 2021-05-21 DIAGNOSIS — E1122 Type 2 diabetes mellitus with diabetic chronic kidney disease: Secondary | ICD-10-CM | POA: Diagnosis present

## 2021-05-21 DIAGNOSIS — Z7982 Long term (current) use of aspirin: Secondary | ICD-10-CM | POA: Diagnosis not present

## 2021-05-21 DIAGNOSIS — I10 Essential (primary) hypertension: Secondary | ICD-10-CM | POA: Diagnosis not present

## 2021-05-21 DIAGNOSIS — N179 Acute kidney failure, unspecified: Secondary | ICD-10-CM | POA: Diagnosis present

## 2021-05-21 DIAGNOSIS — R188 Other ascites: Secondary | ICD-10-CM | POA: Diagnosis not present

## 2021-05-21 DIAGNOSIS — J440 Chronic obstructive pulmonary disease with acute lower respiratory infection: Secondary | ICD-10-CM | POA: Diagnosis present

## 2021-05-21 DIAGNOSIS — R7881 Bacteremia: Secondary | ICD-10-CM | POA: Diagnosis not present

## 2021-05-21 DIAGNOSIS — Z7984 Long term (current) use of oral hypoglycemic drugs: Secondary | ICD-10-CM | POA: Diagnosis not present

## 2021-05-21 DIAGNOSIS — M3500 Sicca syndrome, unspecified: Secondary | ICD-10-CM | POA: Diagnosis present

## 2021-05-21 DIAGNOSIS — R0603 Acute respiratory distress: Secondary | ICD-10-CM | POA: Diagnosis not present

## 2021-05-21 DIAGNOSIS — E8721 Acute metabolic acidosis: Secondary | ICD-10-CM | POA: Diagnosis present

## 2021-05-21 DIAGNOSIS — R6521 Severe sepsis with septic shock: Secondary | ICD-10-CM | POA: Diagnosis present

## 2021-05-21 DIAGNOSIS — D649 Anemia, unspecified: Secondary | ICD-10-CM | POA: Diagnosis present

## 2021-05-22 DIAGNOSIS — I10 Essential (primary) hypertension: Secondary | ICD-10-CM | POA: Diagnosis not present

## 2021-05-26 DIAGNOSIS — N170 Acute kidney failure with tubular necrosis: Secondary | ICD-10-CM | POA: Diagnosis not present

## 2021-05-26 DIAGNOSIS — Z66 Do not resuscitate: Secondary | ICD-10-CM | POA: Diagnosis not present

## 2021-05-26 DIAGNOSIS — N179 Acute kidney failure, unspecified: Secondary | ICD-10-CM | POA: Diagnosis not present

## 2021-05-26 DIAGNOSIS — R0602 Shortness of breath: Secondary | ICD-10-CM | POA: Diagnosis not present

## 2021-05-26 DIAGNOSIS — I129 Hypertensive chronic kidney disease with stage 1 through stage 4 chronic kidney disease, or unspecified chronic kidney disease: Secondary | ICD-10-CM | POA: Diagnosis not present

## 2021-05-26 DIAGNOSIS — N1832 Chronic kidney disease, stage 3b: Secondary | ICD-10-CM | POA: Diagnosis not present

## 2021-05-26 DIAGNOSIS — Z6833 Body mass index (BMI) 33.0-33.9, adult: Secondary | ICD-10-CM | POA: Diagnosis not present

## 2021-05-26 DIAGNOSIS — K589 Irritable bowel syndrome without diarrhea: Secondary | ICD-10-CM | POA: Diagnosis present

## 2021-05-26 DIAGNOSIS — C3491 Malignant neoplasm of unspecified part of right bronchus or lung: Secondary | ICD-10-CM | POA: Diagnosis not present

## 2021-05-26 DIAGNOSIS — I480 Paroxysmal atrial fibrillation: Secondary | ICD-10-CM | POA: Diagnosis not present

## 2021-05-26 DIAGNOSIS — I10 Essential (primary) hypertension: Secondary | ICD-10-CM | POA: Diagnosis not present

## 2021-05-26 DIAGNOSIS — J441 Chronic obstructive pulmonary disease with (acute) exacerbation: Secondary | ICD-10-CM | POA: Diagnosis not present

## 2021-05-26 DIAGNOSIS — J449 Chronic obstructive pulmonary disease, unspecified: Secondary | ICD-10-CM | POA: Diagnosis not present

## 2021-05-26 DIAGNOSIS — R0600 Dyspnea, unspecified: Secondary | ICD-10-CM | POA: Diagnosis not present

## 2021-05-26 DIAGNOSIS — E1165 Type 2 diabetes mellitus with hyperglycemia: Secondary | ICD-10-CM | POA: Diagnosis not present

## 2021-05-26 DIAGNOSIS — N186 End stage renal disease: Secondary | ICD-10-CM | POA: Diagnosis not present

## 2021-05-26 DIAGNOSIS — M797 Fibromyalgia: Secondary | ICD-10-CM | POA: Diagnosis not present

## 2021-05-26 DIAGNOSIS — J189 Pneumonia, unspecified organism: Secondary | ICD-10-CM | POA: Diagnosis not present

## 2021-05-26 DIAGNOSIS — J9601 Acute respiratory failure with hypoxia: Secondary | ICD-10-CM | POA: Diagnosis not present

## 2021-05-26 DIAGNOSIS — C3490 Malignant neoplasm of unspecified part of unspecified bronchus or lung: Secondary | ICD-10-CM | POA: Diagnosis not present

## 2021-05-26 DIAGNOSIS — E039 Hypothyroidism, unspecified: Secondary | ICD-10-CM | POA: Diagnosis present

## 2021-05-26 DIAGNOSIS — F419 Anxiety disorder, unspecified: Secondary | ICD-10-CM | POA: Diagnosis present

## 2021-05-26 DIAGNOSIS — J9621 Acute and chronic respiratory failure with hypoxia: Secondary | ICD-10-CM | POA: Diagnosis not present

## 2021-05-26 DIAGNOSIS — M329 Systemic lupus erythematosus, unspecified: Secondary | ICD-10-CM | POA: Diagnosis present

## 2021-05-26 DIAGNOSIS — E1122 Type 2 diabetes mellitus with diabetic chronic kidney disease: Secondary | ICD-10-CM | POA: Diagnosis present

## 2021-05-26 DIAGNOSIS — G2581 Restless legs syndrome: Secondary | ICD-10-CM | POA: Diagnosis present

## 2021-05-26 DIAGNOSIS — Z515 Encounter for palliative care: Secondary | ICD-10-CM | POA: Diagnosis not present

## 2021-05-26 DIAGNOSIS — J9622 Acute and chronic respiratory failure with hypercapnia: Secondary | ICD-10-CM | POA: Diagnosis not present

## 2021-05-26 DIAGNOSIS — G4733 Obstructive sleep apnea (adult) (pediatric): Secondary | ICD-10-CM | POA: Diagnosis present

## 2021-05-26 DIAGNOSIS — M069 Rheumatoid arthritis, unspecified: Secondary | ICD-10-CM | POA: Diagnosis present

## 2021-05-26 DIAGNOSIS — E1140 Type 2 diabetes mellitus with diabetic neuropathy, unspecified: Secondary | ICD-10-CM | POA: Diagnosis present

## 2021-05-26 DIAGNOSIS — I272 Pulmonary hypertension, unspecified: Secondary | ICD-10-CM | POA: Diagnosis not present

## 2021-05-26 DIAGNOSIS — I712 Thoracic aortic aneurysm, without rupture, unspecified: Secondary | ICD-10-CM | POA: Diagnosis present

## 2021-05-26 DIAGNOSIS — A419 Sepsis, unspecified organism: Secondary | ICD-10-CM | POA: Diagnosis not present

## 2021-05-26 DIAGNOSIS — J9602 Acute respiratory failure with hypercapnia: Secondary | ICD-10-CM | POA: Diagnosis not present

## 2021-06-23 DEATH — deceased

## 2021-07-10 IMAGING — MR MR LUMBAR SPINE WO/W CM
4 of 5 series · 26 of 48 positions shown · IV contrast (gadavist)
Comparison: MRI 07/10/2015

CLINICAL DATA: Chronic low back pain

EXAM:
MRI LUMBAR SPINE WITHOUT AND WITH CONTRAST
TECHNIQUE: Multiplanar and multiecho pulse sequences of the lumbar spine were
obtained without and with intravenous contrast.
CONTRAST:  7mL GADAVIST GADOBUTROL 1 MMOL/ML IV SOLN

[Series 6: T1 · sagittal · 4.0mm · 0.88mm/px · 6 of 16 slices shown (1 of 2)]
[im 1/16]
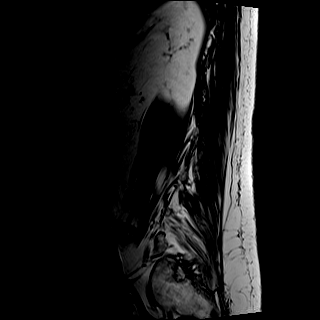
[im 4/16]
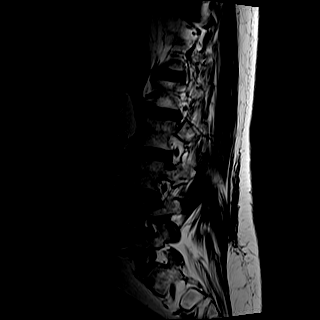
[im 7/16]
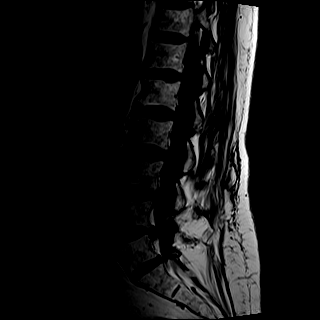
[im 10/16]
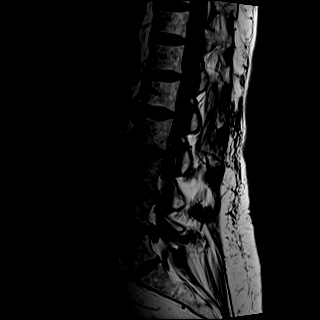
[im 13/16]
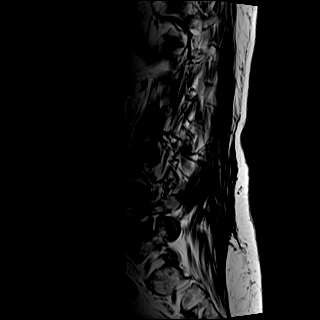
[im 16/16]
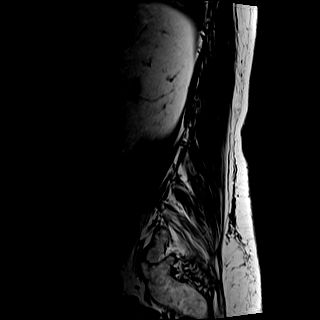

[Series 8: T2 · sagittal · 4.0mm · 0.73mm/px · 6 of 16 slices shown (1 of 2)]
[im 1/16]
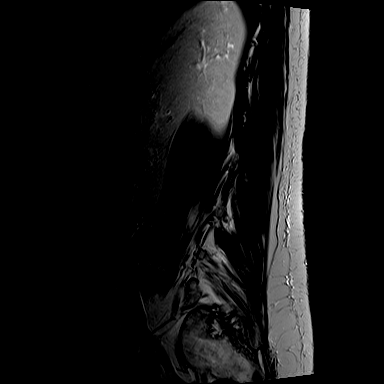
[im 4/16]
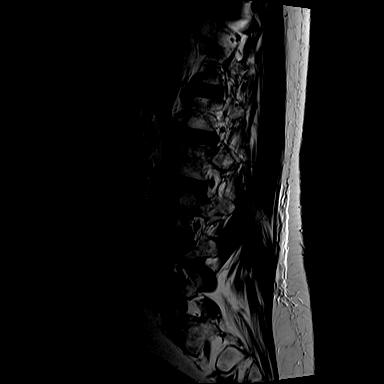
[im 7/16]
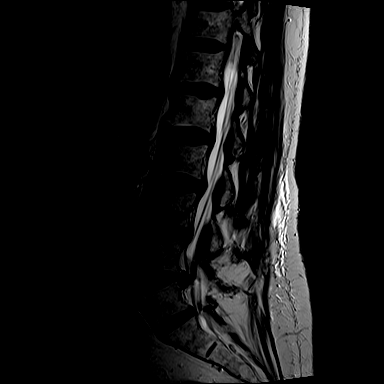
[im 10/16]
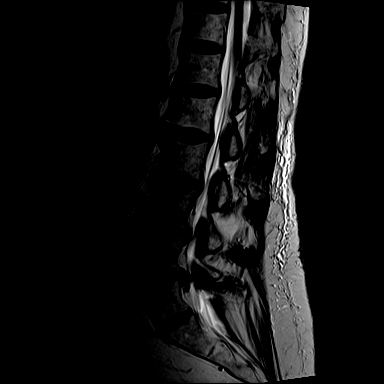
[im 13/16]
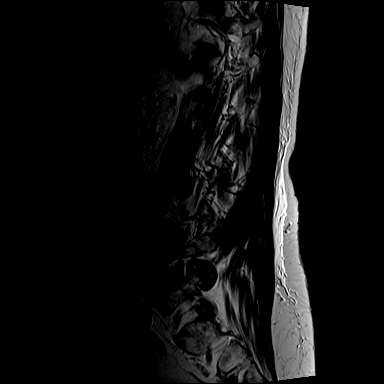
[im 16/16]
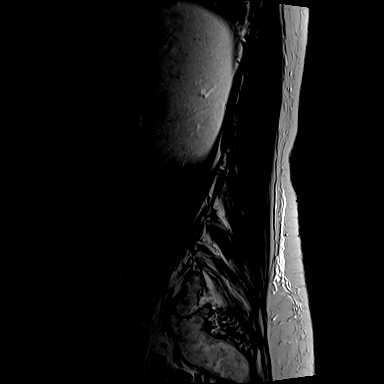

[Series 9: T2 · axial · 4.0mm · 0.57mm/px · z∈[-156,+66]mm · 9 of 40 slices shown (2 of 2)]
[im 1/40]
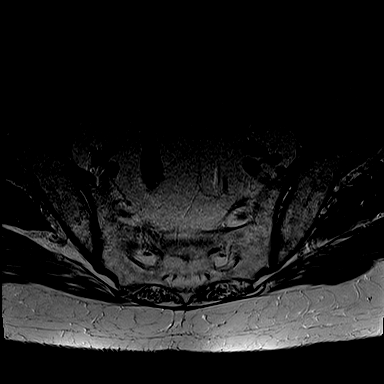
[im 6/40]
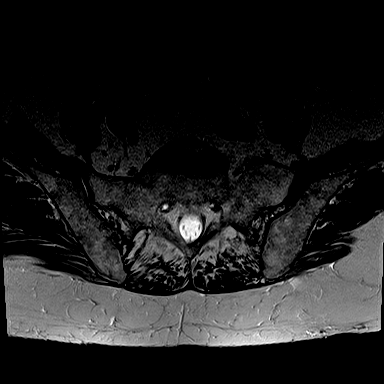
[im 12/40]
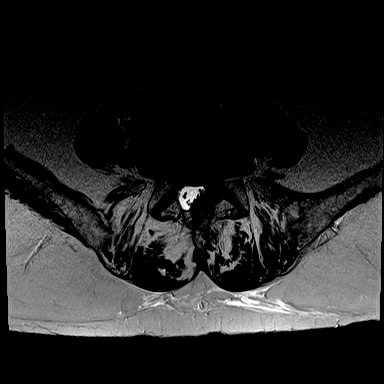
[im 17/40]
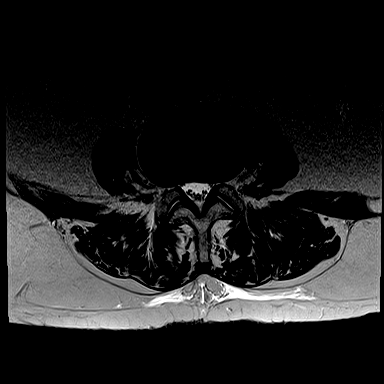
[im 20/40]
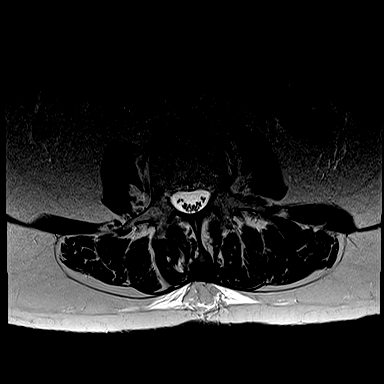
[im 23/40]
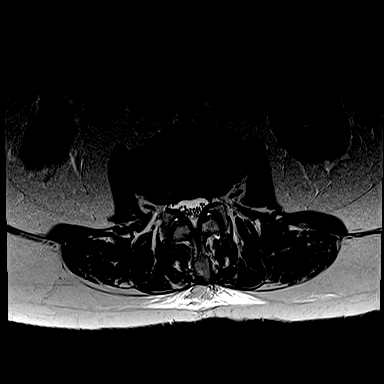
[im 28/40]
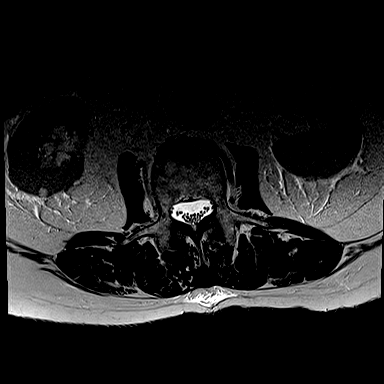
[im 34/40]
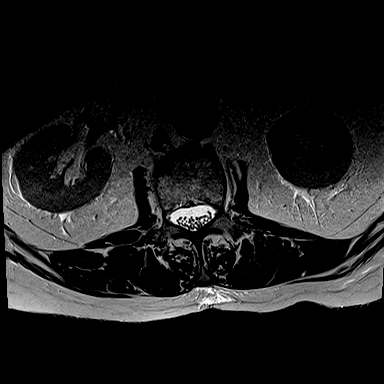
[im 40/40]
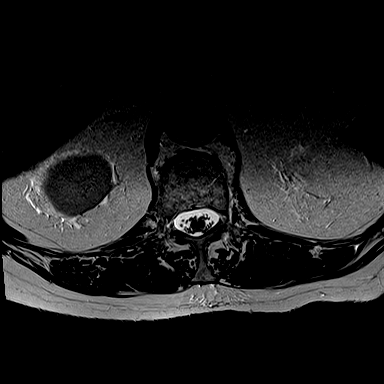

[Series 10: T1 · axial · 4.0mm · 0.34mm/px · z∈[-156,+36]mm · 5 of 40 slices shown (2 of 2)]
[im 1/40]
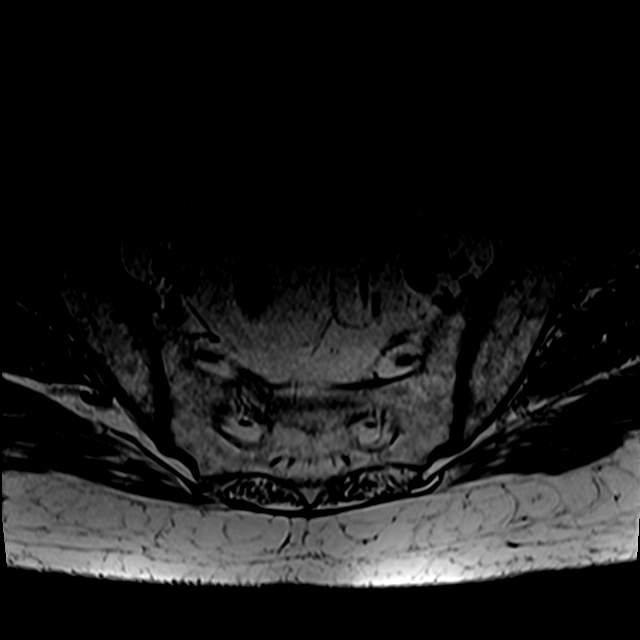
[im 6/40]
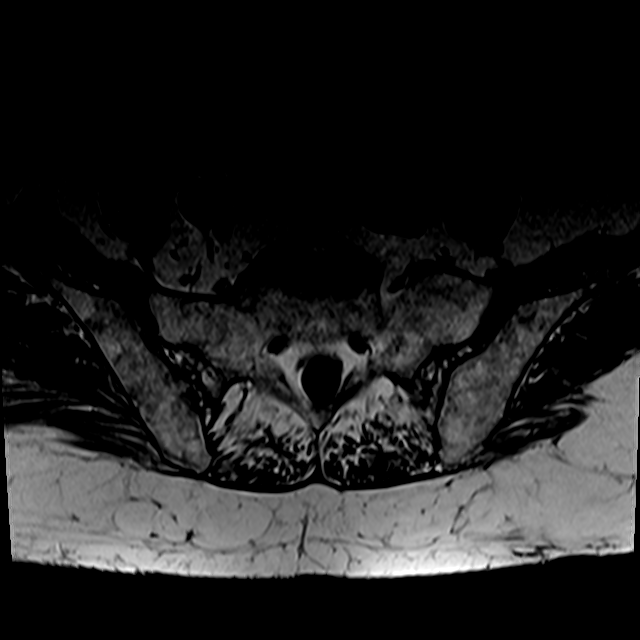
[im 12/40]
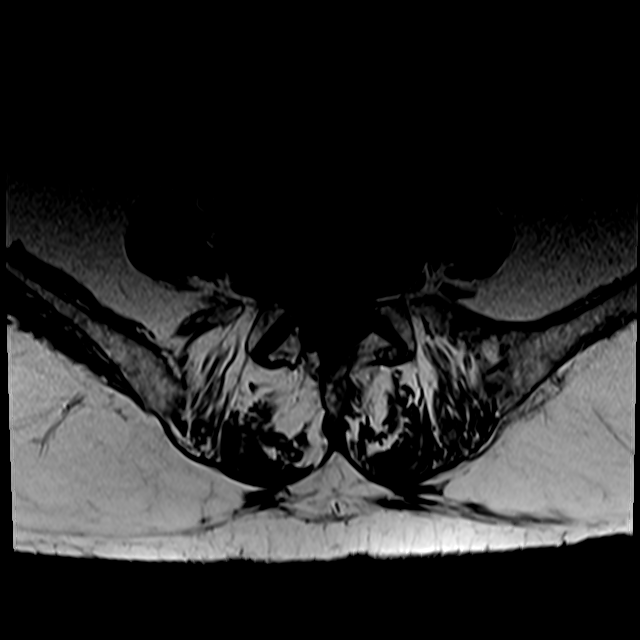
[im 20/40]
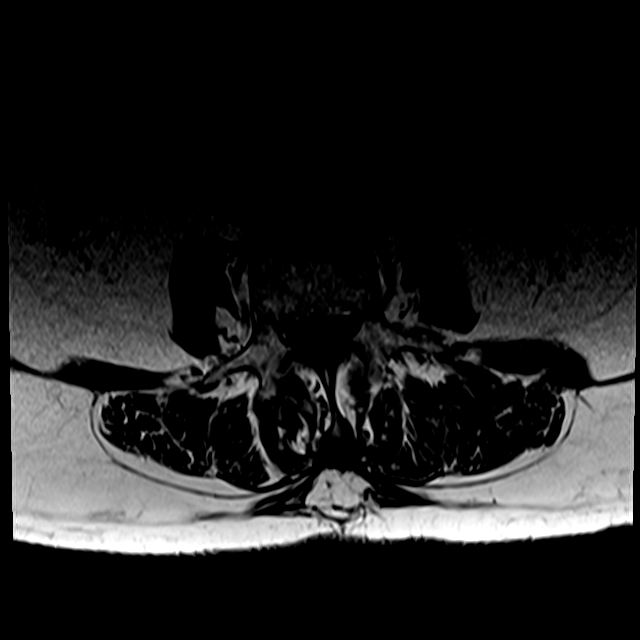
[im 34/40]
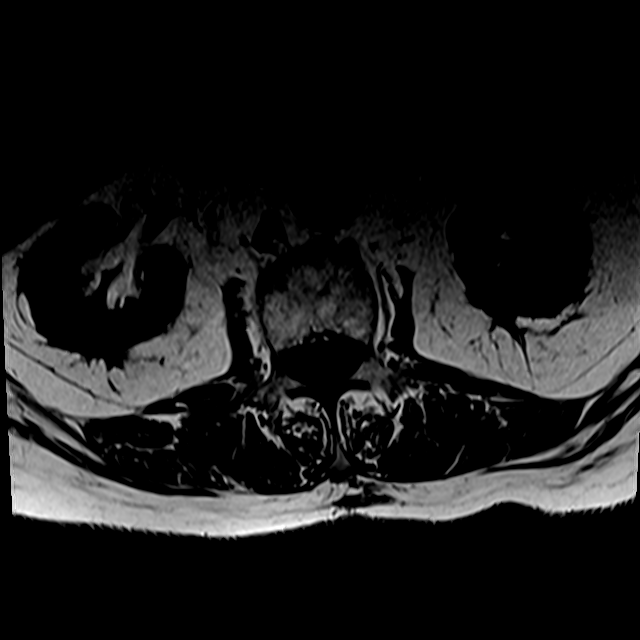

[26 of 48 positions shown; findings below may reference images not displayed]

FINDINGS: Segmentation: Transitional lumbosacral anatomy with partial
sacralization of the L5 segment. Hypoplastic ribs at T12. There is a
well-hydrated slightly hypoplastic disc at the L5-S1 level. Spinal
numbering is consistent with the prior MRI 07/10/2015.

Alignment:  Physiologic.

Vertebrae: No fracture, evidence of discitis, or bone lesion. No
abnormal postcontrast enhancement.

Conus medullaris and cauda equina: Conus extends to the L1 level.
Conus and cauda equina appear normal.

Paraspinal and other soft tissues: Negative.

Disc levels:

T12-L1: Minimal right paracentral disc bulge and mild bilateral
facet arthrosis. No foraminal or canal stenosis.

L1-L2: Minimal diffuse disc bulge with mild bilateral facet
arthrosis. No foraminal or canal stenosis.

L2-L3: Mild diffuse disc bulge resulting in impress upon the ventral
CSF without canal stenosis. Mild bilateral facet arthrosis. No
foraminal stenosis.

L3-L4: Increasing diffuse disc bulge. Bilateral facet arthrosis and
ligamentum flavum buckling result in mild bilateral subarticular
recess stenosis. No foraminal or canal stenosis.

L4-L5: Prior right laminectomy changes. Diffuse disc bulge and
bilateral facet arthrosis. Moderate right and mild left foraminal
stenosis is similar to prior. Severe left and moderate right
subarticular recess stenosis is similar to prior. No canal stenosis.
No abnormal enhancement on postcontrast images.

L5-S1: Transitional. No stenosis.
IMPRESSION: 1. Transitional lumbosacral anatomy with partial sacralization of
the L5 segment. Spinal numbering is consistent with the prior MRI
07/10/2015.
[DATE]. Slight progression of diffuse disc bulge at L3-L4 resulting in
mild bilateral subarticular recess stenosis without canal stenosis.
3. Stable bilateral foraminal and subarticular recess stenosis at
L4-L5.
